# Patient Record
Sex: Male | Born: 1990 | Race: White | Hispanic: No | Marital: Single | Smoking: Never smoker
Health system: Southern US, Community
[De-identification: ages and names within clinical notes are randomized; demographics above are authoritative.]

---
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
## 2010-10-08 ENCOUNTER — Emergency Department (HOSPITAL_COMMUNITY)
Admission: EM | Admit: 2010-10-08 | Discharge: 2010-10-08 | Disposition: A | Discharge status: Home / Self Care | Payer: 59 | Attending: Emergency Medicine | Admitting: Emergency Medicine

## 2010-10-08 DIAGNOSIS — S41119A Laceration without foreign body of unspecified upper arm, initial encounter: Secondary | ICD-10-CM

## 2010-10-08 DIAGNOSIS — S51809A Unspecified open wound of unspecified forearm, initial encounter: Secondary | ICD-10-CM | POA: Insufficient documentation

## 2010-10-08 DIAGNOSIS — W260XXA Contact with knife, initial encounter: Secondary | ICD-10-CM | POA: Insufficient documentation

## 2010-10-08 DIAGNOSIS — W261XXA Contact with sword or dagger, initial encounter: Secondary | ICD-10-CM | POA: Insufficient documentation

## 2010-10-08 NOTE — ED Notes (Signed)
Pt cutting something with knife and knife tip went into left fa. Bleeding controlled with pressure. Sensation present in fingers and able to move all fingers. tetanus unknown.

## 2010-10-08 NOTE — ED Notes (Signed)
Pt has small laceration to his left forearm. States that he accidentally cut it with his pocket knife. No bleeding at this time. Alert and oriented x 3. Skin warm and dry. Color pink. Breath sounds clear and equal bilaterally. Family at bedside.

## 2010-10-08 NOTE — ED Provider Notes (Addendum)
History     Chief Complaint  Patient presents with  . Laceration   HPI Comments: Pt accidentally cut himself with a knife while cutting tape, c/o bleeding to left forearm with minimal pain; bleeding controlled in ED.  Patient is a 20 y.o. male presenting with skin laceration. The history is provided by the patient.  Laceration  The incident occurred less than 1 hour ago. The laceration is located on the left arm. The laceration is 1 cm in size. Injury mechanism: clean knife. The pain is mild. He reports no foreign bodies present. His tetanus status is UTD.    History reviewed. No pertinent past medical history.  History reviewed. No pertinent past surgical history.  No family history on file.  History  Substance Use Topics  . Smoking status: Never Smoker   . Smokeless tobacco: Not on file  . Alcohol Use: No      Review of Systems  Constitutional: Negative for fatigue.  HENT: Negative for congestion and sinus pressure.   Eyes: Negative for discharge.  Respiratory: Negative for cough.   Gastrointestinal: Negative for abdominal pain and diarrhea.  Skin: Negative for rash.  Neurological: Negative for numbness.  Hematological: Negative.   Psychiatric/Behavioral: Negative for hallucinations.    Physical Exam  BP 137/79  Pulse 84  Temp(Src) 98.4 F (36.9 C) (Oral)  Resp 14  Ht 5\' 8"  (1.727 m)  Wt 145 lb (65.772 kg)  BMI 22.05 kg/m2  SpO2 96%  Physical Exam  Constitutional: He is oriented to person, place, and time. He appears well-developed.  HENT:  Head: Normocephalic.  Eyes: Conjunctivae are normal.  Neck: No tracheal deviation present.  Musculoskeletal: Normal range of motion.       1cm in width superficial laceration to anterior left forearm, no active bleeding  Neurological: He is oriented to person, place, and time.  Skin: Skin is warm.  Psychiatric: He has a normal mood and affect.    ED Course  LACERATION REPAIR Date/Time: 10/08/2010 11:17  AM Performed by: Lorelai Huyser L Authorized by: Benny Lennert Consent: Verbal consent obtained. Patient understanding: patient states understanding of the procedure being performed Location: left anterior forearm. Laceration length: 1 cm Foreign bodies: no foreign bodies Tendon involvement: none Nerve involvement: none Vascular damage: no Local anesthetic: none. Irrigation solution: betadine. Amount of cleaning: standard Skin closure: staples Number of sutures: 2 Approximation difficulty: simple Patient tolerance: Patient tolerated the procedure well with no immediate complications. Comments: 1 cm width linear laceration to left anterior forearm; cleaned with betadine; 2 staples    Written by Enos Fling acting as scribe for Dr. Estell Harpin.  MDM       Benny Lennert, MD 10/08/10 1132  Benny Lennert, MD 10/08/10 1133   The chart was scribed for me under my direct supervision.  I personally performed the history, physical, and medical decision making and all procedures in the evaluation of this patient.Benny Lennert, MD 11/15/10 [phone]

## 2019-01-11 ENCOUNTER — Other Ambulatory Visit: Payer: Self-pay | Admitting: *Deleted

## 2019-01-11 DIAGNOSIS — Z20822 Contact with and (suspected) exposure to covid-19: Secondary | ICD-10-CM

## 2019-01-13 ENCOUNTER — Telehealth: Payer: Self-pay | Admitting: General Practice

## 2019-01-13 LAB — NOVEL CORONAVIRUS, NAA: SARS-CoV-2, NAA: NOT DETECTED

## 2019-01-13 NOTE — Telephone Encounter (Signed)
Patient called for Covid test results. He was told that Covid was Not Detected.

## 2020-04-02 DIAGNOSIS — Z658 Other specified problems related to psychosocial circumstances: Secondary | ICD-10-CM | POA: Diagnosis not present

## 2020-04-02 DIAGNOSIS — Z600 Problems of adjustment to life-cycle transitions: Secondary | ICD-10-CM | POA: Diagnosis not present

## 2020-04-02 DIAGNOSIS — R69 Illness, unspecified: Secondary | ICD-10-CM | POA: Diagnosis not present

## 2020-04-02 DIAGNOSIS — Z729 Problem related to lifestyle, unspecified: Secondary | ICD-10-CM | POA: Diagnosis not present

## 2020-04-02 DIAGNOSIS — Z569 Unspecified problems related to employment: Secondary | ICD-10-CM | POA: Diagnosis not present

## 2020-04-05 DIAGNOSIS — Z729 Problem related to lifestyle, unspecified: Secondary | ICD-10-CM | POA: Diagnosis not present

## 2020-04-05 DIAGNOSIS — R69 Illness, unspecified: Secondary | ICD-10-CM | POA: Diagnosis not present

## 2020-04-05 DIAGNOSIS — Z600 Problems of adjustment to life-cycle transitions: Secondary | ICD-10-CM | POA: Diagnosis not present

## 2020-04-05 DIAGNOSIS — Z658 Other specified problems related to psychosocial circumstances: Secondary | ICD-10-CM | POA: Diagnosis not present

## 2020-04-05 DIAGNOSIS — Z569 Unspecified problems related to employment: Secondary | ICD-10-CM | POA: Diagnosis not present

## 2020-04-09 DIAGNOSIS — Z658 Other specified problems related to psychosocial circumstances: Secondary | ICD-10-CM | POA: Diagnosis not present

## 2020-04-09 DIAGNOSIS — Z600 Problems of adjustment to life-cycle transitions: Secondary | ICD-10-CM | POA: Diagnosis not present

## 2020-04-09 DIAGNOSIS — Z729 Problem related to lifestyle, unspecified: Secondary | ICD-10-CM | POA: Diagnosis not present

## 2020-04-09 DIAGNOSIS — R69 Illness, unspecified: Secondary | ICD-10-CM | POA: Diagnosis not present

## 2020-04-09 DIAGNOSIS — Z569 Unspecified problems related to employment: Secondary | ICD-10-CM | POA: Diagnosis not present

## 2020-04-11 DIAGNOSIS — F339 Major depressive disorder, recurrent, unspecified: Secondary | ICD-10-CM | POA: Diagnosis not present

## 2020-04-11 DIAGNOSIS — R69 Illness, unspecified: Secondary | ICD-10-CM | POA: Diagnosis not present

## 2020-04-12 DIAGNOSIS — R69 Illness, unspecified: Secondary | ICD-10-CM | POA: Diagnosis not present

## 2020-04-12 DIAGNOSIS — Z658 Other specified problems related to psychosocial circumstances: Secondary | ICD-10-CM | POA: Diagnosis not present

## 2020-04-12 DIAGNOSIS — Z569 Unspecified problems related to employment: Secondary | ICD-10-CM | POA: Diagnosis not present

## 2020-04-12 DIAGNOSIS — Z600 Problems of adjustment to life-cycle transitions: Secondary | ICD-10-CM | POA: Diagnosis not present

## 2020-04-12 DIAGNOSIS — Z729 Problem related to lifestyle, unspecified: Secondary | ICD-10-CM | POA: Diagnosis not present

## 2020-04-16 DIAGNOSIS — Z600 Problems of adjustment to life-cycle transitions: Secondary | ICD-10-CM | POA: Diagnosis not present

## 2020-04-16 DIAGNOSIS — R69 Illness, unspecified: Secondary | ICD-10-CM | POA: Diagnosis not present

## 2020-04-16 DIAGNOSIS — Z658 Other specified problems related to psychosocial circumstances: Secondary | ICD-10-CM | POA: Diagnosis not present

## 2020-04-16 DIAGNOSIS — Z729 Problem related to lifestyle, unspecified: Secondary | ICD-10-CM | POA: Diagnosis not present

## 2020-04-18 DIAGNOSIS — Z029 Encounter for administrative examinations, unspecified: Secondary | ICD-10-CM | POA: Diagnosis not present

## 2020-04-19 DIAGNOSIS — Z729 Problem related to lifestyle, unspecified: Secondary | ICD-10-CM | POA: Diagnosis not present

## 2020-04-19 DIAGNOSIS — R69 Illness, unspecified: Secondary | ICD-10-CM | POA: Diagnosis not present

## 2020-04-19 DIAGNOSIS — Z658 Other specified problems related to psychosocial circumstances: Secondary | ICD-10-CM | POA: Diagnosis not present

## 2020-04-19 DIAGNOSIS — Z600 Problems of adjustment to life-cycle transitions: Secondary | ICD-10-CM | POA: Diagnosis not present

## 2020-04-23 DIAGNOSIS — Z600 Problems of adjustment to life-cycle transitions: Secondary | ICD-10-CM | POA: Diagnosis not present

## 2020-04-23 DIAGNOSIS — R69 Illness, unspecified: Secondary | ICD-10-CM | POA: Diagnosis not present

## 2020-04-23 DIAGNOSIS — Z729 Problem related to lifestyle, unspecified: Secondary | ICD-10-CM | POA: Diagnosis not present

## 2020-04-23 DIAGNOSIS — Z658 Other specified problems related to psychosocial circumstances: Secondary | ICD-10-CM | POA: Diagnosis not present

## 2020-04-26 DIAGNOSIS — Z658 Other specified problems related to psychosocial circumstances: Secondary | ICD-10-CM | POA: Diagnosis not present

## 2020-04-26 DIAGNOSIS — Z729 Problem related to lifestyle, unspecified: Secondary | ICD-10-CM | POA: Diagnosis not present

## 2020-04-26 DIAGNOSIS — R69 Illness, unspecified: Secondary | ICD-10-CM | POA: Diagnosis not present

## 2020-04-26 DIAGNOSIS — Z600 Problems of adjustment to life-cycle transitions: Secondary | ICD-10-CM | POA: Diagnosis not present

## 2020-05-02 DIAGNOSIS — Z600 Problems of adjustment to life-cycle transitions: Secondary | ICD-10-CM | POA: Diagnosis not present

## 2020-05-02 DIAGNOSIS — R69 Illness, unspecified: Secondary | ICD-10-CM | POA: Diagnosis not present

## 2020-05-02 DIAGNOSIS — Z729 Problem related to lifestyle, unspecified: Secondary | ICD-10-CM | POA: Diagnosis not present

## 2020-05-02 DIAGNOSIS — Z658 Other specified problems related to psychosocial circumstances: Secondary | ICD-10-CM | POA: Diagnosis not present

## 2020-05-02 DIAGNOSIS — Z569 Unspecified problems related to employment: Secondary | ICD-10-CM | POA: Diagnosis not present

## 2020-05-03 DIAGNOSIS — Z569 Unspecified problems related to employment: Secondary | ICD-10-CM | POA: Diagnosis not present

## 2020-05-03 DIAGNOSIS — Z729 Problem related to lifestyle, unspecified: Secondary | ICD-10-CM | POA: Diagnosis not present

## 2020-05-03 DIAGNOSIS — Z600 Problems of adjustment to life-cycle transitions: Secondary | ICD-10-CM | POA: Diagnosis not present

## 2020-05-03 DIAGNOSIS — R69 Illness, unspecified: Secondary | ICD-10-CM | POA: Diagnosis not present

## 2020-05-03 DIAGNOSIS — Z658 Other specified problems related to psychosocial circumstances: Secondary | ICD-10-CM | POA: Diagnosis not present

## 2020-05-11 DIAGNOSIS — Z569 Unspecified problems related to employment: Secondary | ICD-10-CM | POA: Diagnosis not present

## 2020-05-11 DIAGNOSIS — Z600 Problems of adjustment to life-cycle transitions: Secondary | ICD-10-CM | POA: Diagnosis not present

## 2020-05-11 DIAGNOSIS — Z658 Other specified problems related to psychosocial circumstances: Secondary | ICD-10-CM | POA: Diagnosis not present

## 2020-05-11 DIAGNOSIS — R69 Illness, unspecified: Secondary | ICD-10-CM | POA: Diagnosis not present

## 2020-05-11 DIAGNOSIS — Z729 Problem related to lifestyle, unspecified: Secondary | ICD-10-CM | POA: Diagnosis not present

## 2020-05-17 DIAGNOSIS — Z569 Unspecified problems related to employment: Secondary | ICD-10-CM | POA: Diagnosis not present

## 2020-05-17 DIAGNOSIS — R69 Illness, unspecified: Secondary | ICD-10-CM | POA: Diagnosis not present

## 2020-05-17 DIAGNOSIS — Z600 Problems of adjustment to life-cycle transitions: Secondary | ICD-10-CM | POA: Diagnosis not present

## 2020-05-17 DIAGNOSIS — Z729 Problem related to lifestyle, unspecified: Secondary | ICD-10-CM | POA: Diagnosis not present

## 2020-05-17 DIAGNOSIS — Z658 Other specified problems related to psychosocial circumstances: Secondary | ICD-10-CM | POA: Diagnosis not present

## 2020-05-21 DIAGNOSIS — Z600 Problems of adjustment to life-cycle transitions: Secondary | ICD-10-CM | POA: Diagnosis not present

## 2020-05-21 DIAGNOSIS — Z729 Problem related to lifestyle, unspecified: Secondary | ICD-10-CM | POA: Diagnosis not present

## 2020-05-21 DIAGNOSIS — Z658 Other specified problems related to psychosocial circumstances: Secondary | ICD-10-CM | POA: Diagnosis not present

## 2020-05-21 DIAGNOSIS — R69 Illness, unspecified: Secondary | ICD-10-CM | POA: Diagnosis not present

## 2020-05-21 DIAGNOSIS — Z569 Unspecified problems related to employment: Secondary | ICD-10-CM | POA: Diagnosis not present

## 2020-05-24 DIAGNOSIS — Z569 Unspecified problems related to employment: Secondary | ICD-10-CM | POA: Diagnosis not present

## 2020-05-24 DIAGNOSIS — Z600 Problems of adjustment to life-cycle transitions: Secondary | ICD-10-CM | POA: Diagnosis not present

## 2020-05-24 DIAGNOSIS — R69 Illness, unspecified: Secondary | ICD-10-CM | POA: Diagnosis not present

## 2020-05-24 DIAGNOSIS — Z729 Problem related to lifestyle, unspecified: Secondary | ICD-10-CM | POA: Diagnosis not present

## 2020-05-24 DIAGNOSIS — Z658 Other specified problems related to psychosocial circumstances: Secondary | ICD-10-CM | POA: Diagnosis not present

## 2020-05-28 DIAGNOSIS — Z600 Problems of adjustment to life-cycle transitions: Secondary | ICD-10-CM | POA: Diagnosis not present

## 2020-05-28 DIAGNOSIS — Z569 Unspecified problems related to employment: Secondary | ICD-10-CM | POA: Diagnosis not present

## 2020-05-28 DIAGNOSIS — Z658 Other specified problems related to psychosocial circumstances: Secondary | ICD-10-CM | POA: Diagnosis not present

## 2020-05-28 DIAGNOSIS — R69 Illness, unspecified: Secondary | ICD-10-CM | POA: Diagnosis not present

## 2020-05-28 DIAGNOSIS — Z729 Problem related to lifestyle, unspecified: Secondary | ICD-10-CM | POA: Diagnosis not present

## 2020-05-31 DIAGNOSIS — R69 Illness, unspecified: Secondary | ICD-10-CM | POA: Diagnosis not present

## 2020-05-31 DIAGNOSIS — Z658 Other specified problems related to psychosocial circumstances: Secondary | ICD-10-CM | POA: Diagnosis not present

## 2020-05-31 DIAGNOSIS — Z569 Unspecified problems related to employment: Secondary | ICD-10-CM | POA: Diagnosis not present

## 2020-05-31 DIAGNOSIS — Z729 Problem related to lifestyle, unspecified: Secondary | ICD-10-CM | POA: Diagnosis not present

## 2020-05-31 DIAGNOSIS — Z600 Problems of adjustment to life-cycle transitions: Secondary | ICD-10-CM | POA: Diagnosis not present

## 2020-06-04 DIAGNOSIS — Z658 Other specified problems related to psychosocial circumstances: Secondary | ICD-10-CM | POA: Diagnosis not present

## 2020-06-04 DIAGNOSIS — Z569 Unspecified problems related to employment: Secondary | ICD-10-CM | POA: Diagnosis not present

## 2020-06-04 DIAGNOSIS — Z600 Problems of adjustment to life-cycle transitions: Secondary | ICD-10-CM | POA: Diagnosis not present

## 2020-06-04 DIAGNOSIS — R69 Illness, unspecified: Secondary | ICD-10-CM | POA: Diagnosis not present

## 2020-06-04 DIAGNOSIS — Z729 Problem related to lifestyle, unspecified: Secondary | ICD-10-CM | POA: Diagnosis not present

## 2020-06-07 DIAGNOSIS — R69 Illness, unspecified: Secondary | ICD-10-CM | POA: Diagnosis not present

## 2020-06-07 DIAGNOSIS — Z658 Other specified problems related to psychosocial circumstances: Secondary | ICD-10-CM | POA: Diagnosis not present

## 2020-06-07 DIAGNOSIS — Z729 Problem related to lifestyle, unspecified: Secondary | ICD-10-CM | POA: Diagnosis not present

## 2020-06-07 DIAGNOSIS — Z569 Unspecified problems related to employment: Secondary | ICD-10-CM | POA: Diagnosis not present

## 2020-06-07 DIAGNOSIS — Z600 Problems of adjustment to life-cycle transitions: Secondary | ICD-10-CM | POA: Diagnosis not present

## 2020-06-14 DIAGNOSIS — Z658 Other specified problems related to psychosocial circumstances: Secondary | ICD-10-CM | POA: Diagnosis not present

## 2020-06-14 DIAGNOSIS — Z600 Problems of adjustment to life-cycle transitions: Secondary | ICD-10-CM | POA: Diagnosis not present

## 2020-06-14 DIAGNOSIS — Z729 Problem related to lifestyle, unspecified: Secondary | ICD-10-CM | POA: Diagnosis not present

## 2020-06-14 DIAGNOSIS — R69 Illness, unspecified: Secondary | ICD-10-CM | POA: Diagnosis not present

## 2020-06-14 DIAGNOSIS — Z569 Unspecified problems related to employment: Secondary | ICD-10-CM | POA: Diagnosis not present

## 2020-06-19 DIAGNOSIS — R69 Illness, unspecified: Secondary | ICD-10-CM | POA: Diagnosis not present

## 2020-06-19 DIAGNOSIS — Z600 Problems of adjustment to life-cycle transitions: Secondary | ICD-10-CM | POA: Diagnosis not present

## 2020-06-19 DIAGNOSIS — Z658 Other specified problems related to psychosocial circumstances: Secondary | ICD-10-CM | POA: Diagnosis not present

## 2020-06-19 DIAGNOSIS — Z569 Unspecified problems related to employment: Secondary | ICD-10-CM | POA: Diagnosis not present

## 2020-06-19 DIAGNOSIS — Z729 Problem related to lifestyle, unspecified: Secondary | ICD-10-CM | POA: Diagnosis not present

## 2020-06-21 DIAGNOSIS — Z729 Problem related to lifestyle, unspecified: Secondary | ICD-10-CM | POA: Diagnosis not present

## 2020-06-21 DIAGNOSIS — Z600 Problems of adjustment to life-cycle transitions: Secondary | ICD-10-CM | POA: Diagnosis not present

## 2020-06-21 DIAGNOSIS — R69 Illness, unspecified: Secondary | ICD-10-CM | POA: Diagnosis not present

## 2020-06-21 DIAGNOSIS — Z658 Other specified problems related to psychosocial circumstances: Secondary | ICD-10-CM | POA: Diagnosis not present

## 2020-06-21 DIAGNOSIS — Z569 Unspecified problems related to employment: Secondary | ICD-10-CM | POA: Diagnosis not present

## 2020-06-28 DIAGNOSIS — Z729 Problem related to lifestyle, unspecified: Secondary | ICD-10-CM | POA: Diagnosis not present

## 2020-06-28 DIAGNOSIS — Z658 Other specified problems related to psychosocial circumstances: Secondary | ICD-10-CM | POA: Diagnosis not present

## 2020-06-28 DIAGNOSIS — Z600 Problems of adjustment to life-cycle transitions: Secondary | ICD-10-CM | POA: Diagnosis not present

## 2020-06-28 DIAGNOSIS — Z569 Unspecified problems related to employment: Secondary | ICD-10-CM | POA: Diagnosis not present

## 2020-06-28 DIAGNOSIS — R69 Illness, unspecified: Secondary | ICD-10-CM | POA: Diagnosis not present

## 2020-07-05 DIAGNOSIS — R69 Illness, unspecified: Secondary | ICD-10-CM | POA: Diagnosis not present

## 2020-07-05 DIAGNOSIS — Z658 Other specified problems related to psychosocial circumstances: Secondary | ICD-10-CM | POA: Diagnosis not present

## 2020-07-05 DIAGNOSIS — Z729 Problem related to lifestyle, unspecified: Secondary | ICD-10-CM | POA: Diagnosis not present

## 2020-07-05 DIAGNOSIS — Z600 Problems of adjustment to life-cycle transitions: Secondary | ICD-10-CM | POA: Diagnosis not present

## 2020-07-08 DIAGNOSIS — F339 Major depressive disorder, recurrent, unspecified: Secondary | ICD-10-CM | POA: Diagnosis not present

## 2020-07-08 DIAGNOSIS — R69 Illness, unspecified: Secondary | ICD-10-CM | POA: Diagnosis not present

## 2020-07-12 DIAGNOSIS — Z729 Problem related to lifestyle, unspecified: Secondary | ICD-10-CM | POA: Diagnosis not present

## 2020-07-12 DIAGNOSIS — Z658 Other specified problems related to psychosocial circumstances: Secondary | ICD-10-CM | POA: Diagnosis not present

## 2020-07-12 DIAGNOSIS — R69 Illness, unspecified: Secondary | ICD-10-CM | POA: Diagnosis not present

## 2020-07-12 DIAGNOSIS — Z600 Problems of adjustment to life-cycle transitions: Secondary | ICD-10-CM | POA: Diagnosis not present

## 2020-07-19 DIAGNOSIS — R69 Illness, unspecified: Secondary | ICD-10-CM | POA: Diagnosis not present

## 2020-07-19 DIAGNOSIS — Z600 Problems of adjustment to life-cycle transitions: Secondary | ICD-10-CM | POA: Diagnosis not present

## 2020-07-19 DIAGNOSIS — Z658 Other specified problems related to psychosocial circumstances: Secondary | ICD-10-CM | POA: Diagnosis not present

## 2020-07-19 DIAGNOSIS — Z729 Problem related to lifestyle, unspecified: Secondary | ICD-10-CM | POA: Diagnosis not present

## 2020-07-26 DIAGNOSIS — Z658 Other specified problems related to psychosocial circumstances: Secondary | ICD-10-CM | POA: Diagnosis not present

## 2020-07-26 DIAGNOSIS — R69 Illness, unspecified: Secondary | ICD-10-CM | POA: Diagnosis not present

## 2020-07-26 DIAGNOSIS — Z729 Problem related to lifestyle, unspecified: Secondary | ICD-10-CM | POA: Diagnosis not present

## 2020-07-26 DIAGNOSIS — Z600 Problems of adjustment to life-cycle transitions: Secondary | ICD-10-CM | POA: Diagnosis not present

## 2020-08-02 DIAGNOSIS — Z569 Unspecified problems related to employment: Secondary | ICD-10-CM | POA: Diagnosis not present

## 2020-08-02 DIAGNOSIS — Z658 Other specified problems related to psychosocial circumstances: Secondary | ICD-10-CM | POA: Diagnosis not present

## 2020-08-02 DIAGNOSIS — Z729 Problem related to lifestyle, unspecified: Secondary | ICD-10-CM | POA: Diagnosis not present

## 2020-08-02 DIAGNOSIS — R69 Illness, unspecified: Secondary | ICD-10-CM | POA: Diagnosis not present

## 2020-08-02 DIAGNOSIS — Z600 Problems of adjustment to life-cycle transitions: Secondary | ICD-10-CM | POA: Diagnosis not present

## 2020-08-09 DIAGNOSIS — Z569 Unspecified problems related to employment: Secondary | ICD-10-CM | POA: Diagnosis not present

## 2020-08-09 DIAGNOSIS — R69 Illness, unspecified: Secondary | ICD-10-CM | POA: Diagnosis not present

## 2020-08-09 DIAGNOSIS — Z600 Problems of adjustment to life-cycle transitions: Secondary | ICD-10-CM | POA: Diagnosis not present

## 2020-08-09 DIAGNOSIS — Z729 Problem related to lifestyle, unspecified: Secondary | ICD-10-CM | POA: Diagnosis not present

## 2020-08-09 DIAGNOSIS — Z658 Other specified problems related to psychosocial circumstances: Secondary | ICD-10-CM | POA: Diagnosis not present

## 2020-08-16 DIAGNOSIS — Z658 Other specified problems related to psychosocial circumstances: Secondary | ICD-10-CM | POA: Diagnosis not present

## 2020-08-16 DIAGNOSIS — Z729 Problem related to lifestyle, unspecified: Secondary | ICD-10-CM | POA: Diagnosis not present

## 2020-08-16 DIAGNOSIS — Z600 Problems of adjustment to life-cycle transitions: Secondary | ICD-10-CM | POA: Diagnosis not present

## 2020-08-16 DIAGNOSIS — Z569 Unspecified problems related to employment: Secondary | ICD-10-CM | POA: Diagnosis not present

## 2020-08-16 DIAGNOSIS — R69 Illness, unspecified: Secondary | ICD-10-CM | POA: Diagnosis not present

## 2020-08-23 DIAGNOSIS — Z729 Problem related to lifestyle, unspecified: Secondary | ICD-10-CM | POA: Diagnosis not present

## 2020-08-23 DIAGNOSIS — Z658 Other specified problems related to psychosocial circumstances: Secondary | ICD-10-CM | POA: Diagnosis not present

## 2020-08-23 DIAGNOSIS — Z600 Problems of adjustment to life-cycle transitions: Secondary | ICD-10-CM | POA: Diagnosis not present

## 2020-08-23 DIAGNOSIS — R69 Illness, unspecified: Secondary | ICD-10-CM | POA: Diagnosis not present

## 2020-08-23 DIAGNOSIS — Z569 Unspecified problems related to employment: Secondary | ICD-10-CM | POA: Diagnosis not present

## 2020-08-30 DIAGNOSIS — Z600 Problems of adjustment to life-cycle transitions: Secondary | ICD-10-CM | POA: Diagnosis not present

## 2020-08-30 DIAGNOSIS — Z729 Problem related to lifestyle, unspecified: Secondary | ICD-10-CM | POA: Diagnosis not present

## 2020-08-30 DIAGNOSIS — R69 Illness, unspecified: Secondary | ICD-10-CM | POA: Diagnosis not present

## 2020-08-30 DIAGNOSIS — Z569 Unspecified problems related to employment: Secondary | ICD-10-CM | POA: Diagnosis not present

## 2020-08-30 DIAGNOSIS — Z658 Other specified problems related to psychosocial circumstances: Secondary | ICD-10-CM | POA: Diagnosis not present

## 2020-09-02 DIAGNOSIS — F339 Major depressive disorder, recurrent, unspecified: Secondary | ICD-10-CM | POA: Diagnosis not present

## 2020-09-02 DIAGNOSIS — R69 Illness, unspecified: Secondary | ICD-10-CM | POA: Diagnosis not present

## 2020-09-06 DIAGNOSIS — Z729 Problem related to lifestyle, unspecified: Secondary | ICD-10-CM | POA: Diagnosis not present

## 2020-09-06 DIAGNOSIS — Z658 Other specified problems related to psychosocial circumstances: Secondary | ICD-10-CM | POA: Diagnosis not present

## 2020-09-06 DIAGNOSIS — R69 Illness, unspecified: Secondary | ICD-10-CM | POA: Diagnosis not present

## 2020-09-06 DIAGNOSIS — Z569 Unspecified problems related to employment: Secondary | ICD-10-CM | POA: Diagnosis not present

## 2020-09-06 DIAGNOSIS — Z600 Problems of adjustment to life-cycle transitions: Secondary | ICD-10-CM | POA: Diagnosis not present

## 2020-09-13 DIAGNOSIS — Z658 Other specified problems related to psychosocial circumstances: Secondary | ICD-10-CM | POA: Diagnosis not present

## 2020-09-13 DIAGNOSIS — Z569 Unspecified problems related to employment: Secondary | ICD-10-CM | POA: Diagnosis not present

## 2020-09-13 DIAGNOSIS — Z600 Problems of adjustment to life-cycle transitions: Secondary | ICD-10-CM | POA: Diagnosis not present

## 2020-09-13 DIAGNOSIS — Z729 Problem related to lifestyle, unspecified: Secondary | ICD-10-CM | POA: Diagnosis not present

## 2020-09-13 DIAGNOSIS — R69 Illness, unspecified: Secondary | ICD-10-CM | POA: Diagnosis not present

## 2020-09-20 DIAGNOSIS — Z569 Unspecified problems related to employment: Secondary | ICD-10-CM | POA: Diagnosis not present

## 2020-09-20 DIAGNOSIS — Z600 Problems of adjustment to life-cycle transitions: Secondary | ICD-10-CM | POA: Diagnosis not present

## 2020-09-20 DIAGNOSIS — Z729 Problem related to lifestyle, unspecified: Secondary | ICD-10-CM | POA: Diagnosis not present

## 2020-09-20 DIAGNOSIS — Z658 Other specified problems related to psychosocial circumstances: Secondary | ICD-10-CM | POA: Diagnosis not present

## 2020-09-20 DIAGNOSIS — R69 Illness, unspecified: Secondary | ICD-10-CM | POA: Diagnosis not present

## 2020-09-27 DIAGNOSIS — Z600 Problems of adjustment to life-cycle transitions: Secondary | ICD-10-CM | POA: Diagnosis not present

## 2020-09-27 DIAGNOSIS — Z658 Other specified problems related to psychosocial circumstances: Secondary | ICD-10-CM | POA: Diagnosis not present

## 2020-09-27 DIAGNOSIS — R69 Illness, unspecified: Secondary | ICD-10-CM | POA: Diagnosis not present

## 2020-09-27 DIAGNOSIS — Z569 Unspecified problems related to employment: Secondary | ICD-10-CM | POA: Diagnosis not present

## 2020-09-27 DIAGNOSIS — Z729 Problem related to lifestyle, unspecified: Secondary | ICD-10-CM | POA: Diagnosis not present

## 2020-10-04 DIAGNOSIS — Z600 Problems of adjustment to life-cycle transitions: Secondary | ICD-10-CM | POA: Diagnosis not present

## 2020-10-04 DIAGNOSIS — Z729 Problem related to lifestyle, unspecified: Secondary | ICD-10-CM | POA: Diagnosis not present

## 2020-10-04 DIAGNOSIS — R69 Illness, unspecified: Secondary | ICD-10-CM | POA: Diagnosis not present

## 2020-10-04 DIAGNOSIS — Z658 Other specified problems related to psychosocial circumstances: Secondary | ICD-10-CM | POA: Diagnosis not present

## 2020-10-04 DIAGNOSIS — Z569 Unspecified problems related to employment: Secondary | ICD-10-CM | POA: Diagnosis not present

## 2020-10-05 DIAGNOSIS — Z569 Unspecified problems related to employment: Secondary | ICD-10-CM | POA: Diagnosis not present

## 2020-10-05 DIAGNOSIS — R69 Illness, unspecified: Secondary | ICD-10-CM | POA: Diagnosis not present

## 2020-10-05 DIAGNOSIS — Z729 Problem related to lifestyle, unspecified: Secondary | ICD-10-CM | POA: Diagnosis not present

## 2020-10-05 DIAGNOSIS — Z600 Problems of adjustment to life-cycle transitions: Secondary | ICD-10-CM | POA: Diagnosis not present

## 2020-10-05 DIAGNOSIS — Z658 Other specified problems related to psychosocial circumstances: Secondary | ICD-10-CM | POA: Diagnosis not present

## 2020-10-11 DIAGNOSIS — Z658 Other specified problems related to psychosocial circumstances: Secondary | ICD-10-CM | POA: Diagnosis not present

## 2020-10-11 DIAGNOSIS — Z729 Problem related to lifestyle, unspecified: Secondary | ICD-10-CM | POA: Diagnosis not present

## 2020-10-11 DIAGNOSIS — Z569 Unspecified problems related to employment: Secondary | ICD-10-CM | POA: Diagnosis not present

## 2020-10-11 DIAGNOSIS — R69 Illness, unspecified: Secondary | ICD-10-CM | POA: Diagnosis not present

## 2020-10-11 DIAGNOSIS — Z600 Problems of adjustment to life-cycle transitions: Secondary | ICD-10-CM | POA: Diagnosis not present

## 2020-10-15 DIAGNOSIS — Z600 Problems of adjustment to life-cycle transitions: Secondary | ICD-10-CM | POA: Diagnosis not present

## 2020-10-15 DIAGNOSIS — Z569 Unspecified problems related to employment: Secondary | ICD-10-CM | POA: Diagnosis not present

## 2020-10-15 DIAGNOSIS — Z729 Problem related to lifestyle, unspecified: Secondary | ICD-10-CM | POA: Diagnosis not present

## 2020-10-15 DIAGNOSIS — R69 Illness, unspecified: Secondary | ICD-10-CM | POA: Diagnosis not present

## 2020-10-15 DIAGNOSIS — Z658 Other specified problems related to psychosocial circumstances: Secondary | ICD-10-CM | POA: Diagnosis not present

## 2020-10-18 DIAGNOSIS — Z600 Problems of adjustment to life-cycle transitions: Secondary | ICD-10-CM | POA: Diagnosis not present

## 2020-10-18 DIAGNOSIS — Z658 Other specified problems related to psychosocial circumstances: Secondary | ICD-10-CM | POA: Diagnosis not present

## 2020-10-18 DIAGNOSIS — R69 Illness, unspecified: Secondary | ICD-10-CM | POA: Diagnosis not present

## 2020-10-18 DIAGNOSIS — Z569 Unspecified problems related to employment: Secondary | ICD-10-CM | POA: Diagnosis not present

## 2020-10-18 DIAGNOSIS — Z729 Problem related to lifestyle, unspecified: Secondary | ICD-10-CM | POA: Diagnosis not present

## 2020-10-25 DIAGNOSIS — R69 Illness, unspecified: Secondary | ICD-10-CM | POA: Diagnosis not present

## 2020-10-25 DIAGNOSIS — Z729 Problem related to lifestyle, unspecified: Secondary | ICD-10-CM | POA: Diagnosis not present

## 2020-10-25 DIAGNOSIS — Z600 Problems of adjustment to life-cycle transitions: Secondary | ICD-10-CM | POA: Diagnosis not present

## 2020-10-25 DIAGNOSIS — Z658 Other specified problems related to psychosocial circumstances: Secondary | ICD-10-CM | POA: Diagnosis not present

## 2020-10-25 DIAGNOSIS — Z569 Unspecified problems related to employment: Secondary | ICD-10-CM | POA: Diagnosis not present

## 2020-11-01 DIAGNOSIS — Z600 Problems of adjustment to life-cycle transitions: Secondary | ICD-10-CM | POA: Diagnosis not present

## 2020-11-01 DIAGNOSIS — Z569 Unspecified problems related to employment: Secondary | ICD-10-CM | POA: Diagnosis not present

## 2020-11-01 DIAGNOSIS — Z658 Other specified problems related to psychosocial circumstances: Secondary | ICD-10-CM | POA: Diagnosis not present

## 2020-11-01 DIAGNOSIS — R69 Illness, unspecified: Secondary | ICD-10-CM | POA: Diagnosis not present

## 2020-11-01 DIAGNOSIS — Z729 Problem related to lifestyle, unspecified: Secondary | ICD-10-CM | POA: Diagnosis not present

## 2020-11-08 DIAGNOSIS — Z600 Problems of adjustment to life-cycle transitions: Secondary | ICD-10-CM | POA: Diagnosis not present

## 2020-11-08 DIAGNOSIS — R69 Illness, unspecified: Secondary | ICD-10-CM | POA: Diagnosis not present

## 2020-11-08 DIAGNOSIS — Z658 Other specified problems related to psychosocial circumstances: Secondary | ICD-10-CM | POA: Diagnosis not present

## 2020-11-08 DIAGNOSIS — Z569 Unspecified problems related to employment: Secondary | ICD-10-CM | POA: Diagnosis not present

## 2020-11-08 DIAGNOSIS — Z729 Problem related to lifestyle, unspecified: Secondary | ICD-10-CM | POA: Diagnosis not present

## 2020-11-15 DIAGNOSIS — R69 Illness, unspecified: Secondary | ICD-10-CM | POA: Diagnosis not present

## 2020-11-15 DIAGNOSIS — Z600 Problems of adjustment to life-cycle transitions: Secondary | ICD-10-CM | POA: Diagnosis not present

## 2020-11-15 DIAGNOSIS — Z569 Unspecified problems related to employment: Secondary | ICD-10-CM | POA: Diagnosis not present

## 2020-11-15 DIAGNOSIS — Z729 Problem related to lifestyle, unspecified: Secondary | ICD-10-CM | POA: Diagnosis not present

## 2020-11-15 DIAGNOSIS — Z658 Other specified problems related to psychosocial circumstances: Secondary | ICD-10-CM | POA: Diagnosis not present

## 2020-11-22 DIAGNOSIS — Z658 Other specified problems related to psychosocial circumstances: Secondary | ICD-10-CM | POA: Diagnosis not present

## 2020-11-22 DIAGNOSIS — Z729 Problem related to lifestyle, unspecified: Secondary | ICD-10-CM | POA: Diagnosis not present

## 2020-11-22 DIAGNOSIS — Z600 Problems of adjustment to life-cycle transitions: Secondary | ICD-10-CM | POA: Diagnosis not present

## 2020-11-22 DIAGNOSIS — Z569 Unspecified problems related to employment: Secondary | ICD-10-CM | POA: Diagnosis not present

## 2020-11-22 DIAGNOSIS — R69 Illness, unspecified: Secondary | ICD-10-CM | POA: Diagnosis not present

## 2020-11-29 DIAGNOSIS — Z569 Unspecified problems related to employment: Secondary | ICD-10-CM | POA: Diagnosis not present

## 2020-11-29 DIAGNOSIS — R69 Illness, unspecified: Secondary | ICD-10-CM | POA: Diagnosis not present

## 2020-11-29 DIAGNOSIS — Z729 Problem related to lifestyle, unspecified: Secondary | ICD-10-CM | POA: Diagnosis not present

## 2020-11-29 DIAGNOSIS — Z600 Problems of adjustment to life-cycle transitions: Secondary | ICD-10-CM | POA: Diagnosis not present

## 2020-11-29 DIAGNOSIS — Z658 Other specified problems related to psychosocial circumstances: Secondary | ICD-10-CM | POA: Diagnosis not present

## 2020-12-06 DIAGNOSIS — Z658 Other specified problems related to psychosocial circumstances: Secondary | ICD-10-CM | POA: Diagnosis not present

## 2020-12-06 DIAGNOSIS — R69 Illness, unspecified: Secondary | ICD-10-CM | POA: Diagnosis not present

## 2020-12-06 DIAGNOSIS — Z600 Problems of adjustment to life-cycle transitions: Secondary | ICD-10-CM | POA: Diagnosis not present

## 2020-12-06 DIAGNOSIS — Z729 Problem related to lifestyle, unspecified: Secondary | ICD-10-CM | POA: Diagnosis not present

## 2020-12-06 DIAGNOSIS — Z569 Unspecified problems related to employment: Secondary | ICD-10-CM | POA: Diagnosis not present

## 2020-12-13 DIAGNOSIS — Z658 Other specified problems related to psychosocial circumstances: Secondary | ICD-10-CM | POA: Diagnosis not present

## 2020-12-13 DIAGNOSIS — Z729 Problem related to lifestyle, unspecified: Secondary | ICD-10-CM | POA: Diagnosis not present

## 2020-12-13 DIAGNOSIS — R69 Illness, unspecified: Secondary | ICD-10-CM | POA: Diagnosis not present

## 2020-12-13 DIAGNOSIS — Z600 Problems of adjustment to life-cycle transitions: Secondary | ICD-10-CM | POA: Diagnosis not present

## 2020-12-13 DIAGNOSIS — Z569 Unspecified problems related to employment: Secondary | ICD-10-CM | POA: Diagnosis not present

## 2020-12-20 DIAGNOSIS — Z600 Problems of adjustment to life-cycle transitions: Secondary | ICD-10-CM | POA: Diagnosis not present

## 2020-12-20 DIAGNOSIS — Z569 Unspecified problems related to employment: Secondary | ICD-10-CM | POA: Diagnosis not present

## 2020-12-20 DIAGNOSIS — Z729 Problem related to lifestyle, unspecified: Secondary | ICD-10-CM | POA: Diagnosis not present

## 2020-12-20 DIAGNOSIS — R69 Illness, unspecified: Secondary | ICD-10-CM | POA: Diagnosis not present

## 2020-12-20 DIAGNOSIS — Z658 Other specified problems related to psychosocial circumstances: Secondary | ICD-10-CM | POA: Diagnosis not present

## 2020-12-27 DIAGNOSIS — R69 Illness, unspecified: Secondary | ICD-10-CM | POA: Diagnosis not present

## 2020-12-27 DIAGNOSIS — Z569 Unspecified problems related to employment: Secondary | ICD-10-CM | POA: Diagnosis not present

## 2020-12-27 DIAGNOSIS — Z658 Other specified problems related to psychosocial circumstances: Secondary | ICD-10-CM | POA: Diagnosis not present

## 2020-12-27 DIAGNOSIS — Z729 Problem related to lifestyle, unspecified: Secondary | ICD-10-CM | POA: Diagnosis not present

## 2020-12-27 DIAGNOSIS — Z600 Problems of adjustment to life-cycle transitions: Secondary | ICD-10-CM | POA: Diagnosis not present

## 2021-01-03 DIAGNOSIS — R69 Illness, unspecified: Secondary | ICD-10-CM | POA: Diagnosis not present

## 2021-01-03 DIAGNOSIS — Z600 Problems of adjustment to life-cycle transitions: Secondary | ICD-10-CM | POA: Diagnosis not present

## 2021-01-03 DIAGNOSIS — Z729 Problem related to lifestyle, unspecified: Secondary | ICD-10-CM | POA: Diagnosis not present

## 2021-01-03 DIAGNOSIS — Z569 Unspecified problems related to employment: Secondary | ICD-10-CM | POA: Diagnosis not present

## 2021-01-03 DIAGNOSIS — Z658 Other specified problems related to psychosocial circumstances: Secondary | ICD-10-CM | POA: Diagnosis not present

## 2021-01-10 DIAGNOSIS — Z729 Problem related to lifestyle, unspecified: Secondary | ICD-10-CM | POA: Diagnosis not present

## 2021-01-10 DIAGNOSIS — Z658 Other specified problems related to psychosocial circumstances: Secondary | ICD-10-CM | POA: Diagnosis not present

## 2021-01-10 DIAGNOSIS — Z569 Unspecified problems related to employment: Secondary | ICD-10-CM | POA: Diagnosis not present

## 2021-01-10 DIAGNOSIS — R69 Illness, unspecified: Secondary | ICD-10-CM | POA: Diagnosis not present

## 2021-01-10 DIAGNOSIS — Z600 Problems of adjustment to life-cycle transitions: Secondary | ICD-10-CM | POA: Diagnosis not present

## 2021-01-15 DIAGNOSIS — F339 Major depressive disorder, recurrent, unspecified: Secondary | ICD-10-CM | POA: Diagnosis not present

## 2021-01-15 DIAGNOSIS — R69 Illness, unspecified: Secondary | ICD-10-CM | POA: Diagnosis not present

## 2021-01-17 DIAGNOSIS — Z569 Unspecified problems related to employment: Secondary | ICD-10-CM | POA: Diagnosis not present

## 2021-01-17 DIAGNOSIS — Z658 Other specified problems related to psychosocial circumstances: Secondary | ICD-10-CM | POA: Diagnosis not present

## 2021-01-17 DIAGNOSIS — Z600 Problems of adjustment to life-cycle transitions: Secondary | ICD-10-CM | POA: Diagnosis not present

## 2021-01-17 DIAGNOSIS — Z729 Problem related to lifestyle, unspecified: Secondary | ICD-10-CM | POA: Diagnosis not present

## 2021-01-17 DIAGNOSIS — R69 Illness, unspecified: Secondary | ICD-10-CM | POA: Diagnosis not present

## 2021-01-24 DIAGNOSIS — Z569 Unspecified problems related to employment: Secondary | ICD-10-CM | POA: Diagnosis not present

## 2021-01-24 DIAGNOSIS — Z658 Other specified problems related to psychosocial circumstances: Secondary | ICD-10-CM | POA: Diagnosis not present

## 2021-01-24 DIAGNOSIS — Z729 Problem related to lifestyle, unspecified: Secondary | ICD-10-CM | POA: Diagnosis not present

## 2021-01-24 DIAGNOSIS — R69 Illness, unspecified: Secondary | ICD-10-CM | POA: Diagnosis not present

## 2021-01-24 DIAGNOSIS — Z600 Problems of adjustment to life-cycle transitions: Secondary | ICD-10-CM | POA: Diagnosis not present

## 2021-01-31 DIAGNOSIS — Z569 Unspecified problems related to employment: Secondary | ICD-10-CM | POA: Diagnosis not present

## 2021-01-31 DIAGNOSIS — Z600 Problems of adjustment to life-cycle transitions: Secondary | ICD-10-CM | POA: Diagnosis not present

## 2021-01-31 DIAGNOSIS — R69 Illness, unspecified: Secondary | ICD-10-CM | POA: Diagnosis not present

## 2021-01-31 DIAGNOSIS — Z658 Other specified problems related to psychosocial circumstances: Secondary | ICD-10-CM | POA: Diagnosis not present

## 2021-01-31 DIAGNOSIS — Z729 Problem related to lifestyle, unspecified: Secondary | ICD-10-CM | POA: Diagnosis not present

## 2021-02-05 DIAGNOSIS — Z600 Problems of adjustment to life-cycle transitions: Secondary | ICD-10-CM | POA: Diagnosis not present

## 2021-02-05 DIAGNOSIS — Z658 Other specified problems related to psychosocial circumstances: Secondary | ICD-10-CM | POA: Diagnosis not present

## 2021-02-05 DIAGNOSIS — R69 Illness, unspecified: Secondary | ICD-10-CM | POA: Diagnosis not present

## 2021-02-05 DIAGNOSIS — Z729 Problem related to lifestyle, unspecified: Secondary | ICD-10-CM | POA: Diagnosis not present

## 2021-02-07 DIAGNOSIS — Z658 Other specified problems related to psychosocial circumstances: Secondary | ICD-10-CM | POA: Diagnosis not present

## 2021-02-07 DIAGNOSIS — Z729 Problem related to lifestyle, unspecified: Secondary | ICD-10-CM | POA: Diagnosis not present

## 2021-02-07 DIAGNOSIS — Z600 Problems of adjustment to life-cycle transitions: Secondary | ICD-10-CM | POA: Diagnosis not present

## 2021-02-07 DIAGNOSIS — R69 Illness, unspecified: Secondary | ICD-10-CM | POA: Diagnosis not present

## 2021-02-14 DIAGNOSIS — Z729 Problem related to lifestyle, unspecified: Secondary | ICD-10-CM | POA: Diagnosis not present

## 2021-02-14 DIAGNOSIS — Z600 Problems of adjustment to life-cycle transitions: Secondary | ICD-10-CM | POA: Diagnosis not present

## 2021-02-14 DIAGNOSIS — Z658 Other specified problems related to psychosocial circumstances: Secondary | ICD-10-CM | POA: Diagnosis not present

## 2021-02-14 DIAGNOSIS — R69 Illness, unspecified: Secondary | ICD-10-CM | POA: Diagnosis not present

## 2021-02-22 DIAGNOSIS — Z729 Problem related to lifestyle, unspecified: Secondary | ICD-10-CM | POA: Diagnosis not present

## 2021-02-22 DIAGNOSIS — R69 Illness, unspecified: Secondary | ICD-10-CM | POA: Diagnosis not present

## 2021-02-22 DIAGNOSIS — Z600 Problems of adjustment to life-cycle transitions: Secondary | ICD-10-CM | POA: Diagnosis not present

## 2021-02-22 DIAGNOSIS — Z658 Other specified problems related to psychosocial circumstances: Secondary | ICD-10-CM | POA: Diagnosis not present

## 2021-02-28 DIAGNOSIS — Z658 Other specified problems related to psychosocial circumstances: Secondary | ICD-10-CM | POA: Diagnosis not present

## 2021-02-28 DIAGNOSIS — Z729 Problem related to lifestyle, unspecified: Secondary | ICD-10-CM | POA: Diagnosis not present

## 2021-02-28 DIAGNOSIS — R69 Illness, unspecified: Secondary | ICD-10-CM | POA: Diagnosis not present

## 2021-02-28 DIAGNOSIS — Z600 Problems of adjustment to life-cycle transitions: Secondary | ICD-10-CM | POA: Diagnosis not present

## 2021-03-07 DIAGNOSIS — Z569 Unspecified problems related to employment: Secondary | ICD-10-CM | POA: Diagnosis not present

## 2021-03-07 DIAGNOSIS — R69 Illness, unspecified: Secondary | ICD-10-CM | POA: Diagnosis not present

## 2021-03-07 DIAGNOSIS — Z600 Problems of adjustment to life-cycle transitions: Secondary | ICD-10-CM | POA: Diagnosis not present

## 2021-03-07 DIAGNOSIS — Z658 Other specified problems related to psychosocial circumstances: Secondary | ICD-10-CM | POA: Diagnosis not present

## 2021-03-07 DIAGNOSIS — Z729 Problem related to lifestyle, unspecified: Secondary | ICD-10-CM | POA: Diagnosis not present

## 2021-03-14 DIAGNOSIS — Z658 Other specified problems related to psychosocial circumstances: Secondary | ICD-10-CM | POA: Diagnosis not present

## 2021-03-14 DIAGNOSIS — Z569 Unspecified problems related to employment: Secondary | ICD-10-CM | POA: Diagnosis not present

## 2021-03-14 DIAGNOSIS — Z600 Problems of adjustment to life-cycle transitions: Secondary | ICD-10-CM | POA: Diagnosis not present

## 2021-03-14 DIAGNOSIS — Z729 Problem related to lifestyle, unspecified: Secondary | ICD-10-CM | POA: Diagnosis not present

## 2021-03-14 DIAGNOSIS — R69 Illness, unspecified: Secondary | ICD-10-CM | POA: Diagnosis not present

## 2021-03-19 DIAGNOSIS — R69 Illness, unspecified: Secondary | ICD-10-CM | POA: Diagnosis not present

## 2021-03-19 DIAGNOSIS — Z729 Problem related to lifestyle, unspecified: Secondary | ICD-10-CM | POA: Diagnosis not present

## 2021-03-19 DIAGNOSIS — Z600 Problems of adjustment to life-cycle transitions: Secondary | ICD-10-CM | POA: Diagnosis not present

## 2021-03-19 DIAGNOSIS — Z658 Other specified problems related to psychosocial circumstances: Secondary | ICD-10-CM | POA: Diagnosis not present

## 2021-03-19 DIAGNOSIS — Z569 Unspecified problems related to employment: Secondary | ICD-10-CM | POA: Diagnosis not present

## 2021-03-28 DIAGNOSIS — R69 Illness, unspecified: Secondary | ICD-10-CM | POA: Diagnosis not present

## 2021-03-28 DIAGNOSIS — Z569 Unspecified problems related to employment: Secondary | ICD-10-CM | POA: Diagnosis not present

## 2021-03-28 DIAGNOSIS — Z600 Problems of adjustment to life-cycle transitions: Secondary | ICD-10-CM | POA: Diagnosis not present

## 2021-03-28 DIAGNOSIS — Z729 Problem related to lifestyle, unspecified: Secondary | ICD-10-CM | POA: Diagnosis not present

## 2021-03-28 DIAGNOSIS — Z658 Other specified problems related to psychosocial circumstances: Secondary | ICD-10-CM | POA: Diagnosis not present

## 2021-04-04 DIAGNOSIS — Z729 Problem related to lifestyle, unspecified: Secondary | ICD-10-CM | POA: Diagnosis not present

## 2021-04-04 DIAGNOSIS — Z658 Other specified problems related to psychosocial circumstances: Secondary | ICD-10-CM | POA: Diagnosis not present

## 2021-04-04 DIAGNOSIS — Z600 Problems of adjustment to life-cycle transitions: Secondary | ICD-10-CM | POA: Diagnosis not present

## 2021-04-04 DIAGNOSIS — R69 Illness, unspecified: Secondary | ICD-10-CM | POA: Diagnosis not present

## 2021-04-11 DIAGNOSIS — Z658 Other specified problems related to psychosocial circumstances: Secondary | ICD-10-CM | POA: Diagnosis not present

## 2021-04-11 DIAGNOSIS — Z729 Problem related to lifestyle, unspecified: Secondary | ICD-10-CM | POA: Diagnosis not present

## 2021-04-11 DIAGNOSIS — Z600 Problems of adjustment to life-cycle transitions: Secondary | ICD-10-CM | POA: Diagnosis not present

## 2021-04-11 DIAGNOSIS — R69 Illness, unspecified: Secondary | ICD-10-CM | POA: Diagnosis not present

## 2021-04-18 DIAGNOSIS — Z600 Problems of adjustment to life-cycle transitions: Secondary | ICD-10-CM | POA: Diagnosis not present

## 2021-04-18 DIAGNOSIS — Z729 Problem related to lifestyle, unspecified: Secondary | ICD-10-CM | POA: Diagnosis not present

## 2021-04-18 DIAGNOSIS — R69 Illness, unspecified: Secondary | ICD-10-CM | POA: Diagnosis not present

## 2021-04-18 DIAGNOSIS — Z658 Other specified problems related to psychosocial circumstances: Secondary | ICD-10-CM | POA: Diagnosis not present

## 2021-04-25 DIAGNOSIS — R69 Illness, unspecified: Secondary | ICD-10-CM | POA: Diagnosis not present

## 2021-04-25 DIAGNOSIS — Z600 Problems of adjustment to life-cycle transitions: Secondary | ICD-10-CM | POA: Diagnosis not present

## 2021-04-25 DIAGNOSIS — Z658 Other specified problems related to psychosocial circumstances: Secondary | ICD-10-CM | POA: Diagnosis not present

## 2021-04-25 DIAGNOSIS — Z729 Problem related to lifestyle, unspecified: Secondary | ICD-10-CM | POA: Diagnosis not present

## 2021-05-02 DIAGNOSIS — Z729 Problem related to lifestyle, unspecified: Secondary | ICD-10-CM | POA: Diagnosis not present

## 2021-05-02 DIAGNOSIS — Z600 Problems of adjustment to life-cycle transitions: Secondary | ICD-10-CM | POA: Diagnosis not present

## 2021-05-02 DIAGNOSIS — R69 Illness, unspecified: Secondary | ICD-10-CM | POA: Diagnosis not present

## 2021-05-02 DIAGNOSIS — Z658 Other specified problems related to psychosocial circumstances: Secondary | ICD-10-CM | POA: Diagnosis not present

## 2021-05-09 DIAGNOSIS — Z600 Problems of adjustment to life-cycle transitions: Secondary | ICD-10-CM | POA: Diagnosis not present

## 2021-05-09 DIAGNOSIS — Z658 Other specified problems related to psychosocial circumstances: Secondary | ICD-10-CM | POA: Diagnosis not present

## 2021-05-09 DIAGNOSIS — R69 Illness, unspecified: Secondary | ICD-10-CM | POA: Diagnosis not present

## 2021-05-09 DIAGNOSIS — Z729 Problem related to lifestyle, unspecified: Secondary | ICD-10-CM | POA: Diagnosis not present

## 2021-05-16 DIAGNOSIS — Z729 Problem related to lifestyle, unspecified: Secondary | ICD-10-CM | POA: Diagnosis not present

## 2021-05-16 DIAGNOSIS — Z600 Problems of adjustment to life-cycle transitions: Secondary | ICD-10-CM | POA: Diagnosis not present

## 2021-05-16 DIAGNOSIS — Z658 Other specified problems related to psychosocial circumstances: Secondary | ICD-10-CM | POA: Diagnosis not present

## 2021-05-16 DIAGNOSIS — R69 Illness, unspecified: Secondary | ICD-10-CM | POA: Diagnosis not present

## 2021-05-23 DIAGNOSIS — Z658 Other specified problems related to psychosocial circumstances: Secondary | ICD-10-CM | POA: Diagnosis not present

## 2021-05-23 DIAGNOSIS — Z729 Problem related to lifestyle, unspecified: Secondary | ICD-10-CM | POA: Diagnosis not present

## 2021-05-23 DIAGNOSIS — Z600 Problems of adjustment to life-cycle transitions: Secondary | ICD-10-CM | POA: Diagnosis not present

## 2021-05-23 DIAGNOSIS — R69 Illness, unspecified: Secondary | ICD-10-CM | POA: Diagnosis not present

## 2021-05-30 DIAGNOSIS — Z658 Other specified problems related to psychosocial circumstances: Secondary | ICD-10-CM | POA: Diagnosis not present

## 2021-05-30 DIAGNOSIS — Z729 Problem related to lifestyle, unspecified: Secondary | ICD-10-CM | POA: Diagnosis not present

## 2021-05-30 DIAGNOSIS — R69 Illness, unspecified: Secondary | ICD-10-CM | POA: Diagnosis not present

## 2021-05-30 DIAGNOSIS — Z600 Problems of adjustment to life-cycle transitions: Secondary | ICD-10-CM | POA: Diagnosis not present

## 2021-06-06 DIAGNOSIS — R69 Illness, unspecified: Secondary | ICD-10-CM | POA: Diagnosis not present

## 2021-06-06 DIAGNOSIS — Z600 Problems of adjustment to life-cycle transitions: Secondary | ICD-10-CM | POA: Diagnosis not present

## 2021-06-06 DIAGNOSIS — Z658 Other specified problems related to psychosocial circumstances: Secondary | ICD-10-CM | POA: Diagnosis not present

## 2021-06-06 DIAGNOSIS — Z729 Problem related to lifestyle, unspecified: Secondary | ICD-10-CM | POA: Diagnosis not present

## 2021-06-13 DIAGNOSIS — Z600 Problems of adjustment to life-cycle transitions: Secondary | ICD-10-CM | POA: Diagnosis not present

## 2021-06-13 DIAGNOSIS — Z658 Other specified problems related to psychosocial circumstances: Secondary | ICD-10-CM | POA: Diagnosis not present

## 2021-06-13 DIAGNOSIS — Z729 Problem related to lifestyle, unspecified: Secondary | ICD-10-CM | POA: Diagnosis not present

## 2021-06-13 DIAGNOSIS — R69 Illness, unspecified: Secondary | ICD-10-CM | POA: Diagnosis not present

## 2021-06-20 DIAGNOSIS — Z658 Other specified problems related to psychosocial circumstances: Secondary | ICD-10-CM | POA: Diagnosis not present

## 2021-06-20 DIAGNOSIS — Z729 Problem related to lifestyle, unspecified: Secondary | ICD-10-CM | POA: Diagnosis not present

## 2021-06-20 DIAGNOSIS — Z600 Problems of adjustment to life-cycle transitions: Secondary | ICD-10-CM | POA: Diagnosis not present

## 2021-06-20 DIAGNOSIS — R69 Illness, unspecified: Secondary | ICD-10-CM | POA: Diagnosis not present

## 2021-06-27 DIAGNOSIS — R69 Illness, unspecified: Secondary | ICD-10-CM | POA: Diagnosis not present

## 2021-06-27 DIAGNOSIS — Z729 Problem related to lifestyle, unspecified: Secondary | ICD-10-CM | POA: Diagnosis not present

## 2021-06-27 DIAGNOSIS — Z600 Problems of adjustment to life-cycle transitions: Secondary | ICD-10-CM | POA: Diagnosis not present

## 2021-07-04 DIAGNOSIS — Z658 Other specified problems related to psychosocial circumstances: Secondary | ICD-10-CM | POA: Diagnosis not present

## 2021-07-04 DIAGNOSIS — Z729 Problem related to lifestyle, unspecified: Secondary | ICD-10-CM | POA: Diagnosis not present

## 2021-07-04 DIAGNOSIS — R69 Illness, unspecified: Secondary | ICD-10-CM | POA: Diagnosis not present

## 2021-07-11 DIAGNOSIS — Z609 Problem related to social environment, unspecified: Secondary | ICD-10-CM | POA: Diagnosis not present

## 2021-07-11 DIAGNOSIS — Z658 Other specified problems related to psychosocial circumstances: Secondary | ICD-10-CM | POA: Diagnosis not present

## 2021-07-11 DIAGNOSIS — R69 Illness, unspecified: Secondary | ICD-10-CM | POA: Diagnosis not present

## 2021-07-11 DIAGNOSIS — Z600 Problems of adjustment to life-cycle transitions: Secondary | ICD-10-CM | POA: Diagnosis not present

## 2021-07-18 DIAGNOSIS — Z658 Other specified problems related to psychosocial circumstances: Secondary | ICD-10-CM | POA: Diagnosis not present

## 2021-07-18 DIAGNOSIS — Z729 Problem related to lifestyle, unspecified: Secondary | ICD-10-CM | POA: Diagnosis not present

## 2021-07-18 DIAGNOSIS — R69 Illness, unspecified: Secondary | ICD-10-CM | POA: Diagnosis not present

## 2021-07-18 DIAGNOSIS — Z600 Problems of adjustment to life-cycle transitions: Secondary | ICD-10-CM | POA: Diagnosis not present

## 2021-07-25 DIAGNOSIS — R69 Illness, unspecified: Secondary | ICD-10-CM | POA: Diagnosis not present

## 2021-07-25 DIAGNOSIS — Z600 Problems of adjustment to life-cycle transitions: Secondary | ICD-10-CM | POA: Diagnosis not present

## 2021-07-25 DIAGNOSIS — Z729 Problem related to lifestyle, unspecified: Secondary | ICD-10-CM | POA: Diagnosis not present

## 2021-07-25 DIAGNOSIS — Z658 Other specified problems related to psychosocial circumstances: Secondary | ICD-10-CM | POA: Diagnosis not present

## 2021-08-01 DIAGNOSIS — Z658 Other specified problems related to psychosocial circumstances: Secondary | ICD-10-CM | POA: Diagnosis not present

## 2021-08-01 DIAGNOSIS — R69 Illness, unspecified: Secondary | ICD-10-CM | POA: Diagnosis not present

## 2021-08-01 DIAGNOSIS — Z729 Problem related to lifestyle, unspecified: Secondary | ICD-10-CM | POA: Diagnosis not present

## 2021-08-01 DIAGNOSIS — Z600 Problems of adjustment to life-cycle transitions: Secondary | ICD-10-CM | POA: Diagnosis not present

## 2021-08-08 DIAGNOSIS — Z729 Problem related to lifestyle, unspecified: Secondary | ICD-10-CM | POA: Diagnosis not present

## 2021-08-08 DIAGNOSIS — Z658 Other specified problems related to psychosocial circumstances: Secondary | ICD-10-CM | POA: Diagnosis not present

## 2021-08-08 DIAGNOSIS — Z600 Problems of adjustment to life-cycle transitions: Secondary | ICD-10-CM | POA: Diagnosis not present

## 2021-08-08 DIAGNOSIS — R69 Illness, unspecified: Secondary | ICD-10-CM | POA: Diagnosis not present

## 2021-08-15 DIAGNOSIS — R69 Illness, unspecified: Secondary | ICD-10-CM | POA: Diagnosis not present

## 2021-08-15 DIAGNOSIS — Z600 Problems of adjustment to life-cycle transitions: Secondary | ICD-10-CM | POA: Diagnosis not present

## 2021-08-15 DIAGNOSIS — Z658 Other specified problems related to psychosocial circumstances: Secondary | ICD-10-CM | POA: Diagnosis not present

## 2021-08-15 DIAGNOSIS — Z729 Problem related to lifestyle, unspecified: Secondary | ICD-10-CM | POA: Diagnosis not present

## 2021-08-22 DIAGNOSIS — Z729 Problem related to lifestyle, unspecified: Secondary | ICD-10-CM | POA: Diagnosis not present

## 2021-08-22 DIAGNOSIS — Z658 Other specified problems related to psychosocial circumstances: Secondary | ICD-10-CM | POA: Diagnosis not present

## 2021-08-22 DIAGNOSIS — Z600 Problems of adjustment to life-cycle transitions: Secondary | ICD-10-CM | POA: Diagnosis not present

## 2021-08-22 DIAGNOSIS — R69 Illness, unspecified: Secondary | ICD-10-CM | POA: Diagnosis not present

## 2021-08-29 DIAGNOSIS — Z729 Problem related to lifestyle, unspecified: Secondary | ICD-10-CM | POA: Diagnosis not present

## 2021-08-29 DIAGNOSIS — Z569 Unspecified problems related to employment: Secondary | ICD-10-CM | POA: Diagnosis not present

## 2021-08-29 DIAGNOSIS — R69 Illness, unspecified: Secondary | ICD-10-CM | POA: Diagnosis not present

## 2021-08-29 DIAGNOSIS — Z600 Problems of adjustment to life-cycle transitions: Secondary | ICD-10-CM | POA: Diagnosis not present

## 2021-09-12 DIAGNOSIS — Z729 Problem related to lifestyle, unspecified: Secondary | ICD-10-CM | POA: Diagnosis not present

## 2021-09-12 DIAGNOSIS — R69 Illness, unspecified: Secondary | ICD-10-CM | POA: Diagnosis not present

## 2021-09-12 DIAGNOSIS — Z658 Other specified problems related to psychosocial circumstances: Secondary | ICD-10-CM | POA: Diagnosis not present

## 2021-09-12 DIAGNOSIS — Z600 Problems of adjustment to life-cycle transitions: Secondary | ICD-10-CM | POA: Diagnosis not present

## 2021-09-19 DIAGNOSIS — Z658 Other specified problems related to psychosocial circumstances: Secondary | ICD-10-CM | POA: Diagnosis not present

## 2021-09-19 DIAGNOSIS — Z729 Problem related to lifestyle, unspecified: Secondary | ICD-10-CM | POA: Diagnosis not present

## 2021-09-19 DIAGNOSIS — Z600 Problems of adjustment to life-cycle transitions: Secondary | ICD-10-CM | POA: Diagnosis not present

## 2021-09-19 DIAGNOSIS — R69 Illness, unspecified: Secondary | ICD-10-CM | POA: Diagnosis not present

## 2021-09-26 DIAGNOSIS — Z729 Problem related to lifestyle, unspecified: Secondary | ICD-10-CM | POA: Diagnosis not present

## 2021-09-26 DIAGNOSIS — R69 Illness, unspecified: Secondary | ICD-10-CM | POA: Diagnosis not present

## 2021-09-26 DIAGNOSIS — Z600 Problems of adjustment to life-cycle transitions: Secondary | ICD-10-CM | POA: Diagnosis not present

## 2021-09-26 DIAGNOSIS — Z658 Other specified problems related to psychosocial circumstances: Secondary | ICD-10-CM | POA: Diagnosis not present

## 2021-10-03 DIAGNOSIS — Z600 Problems of adjustment to life-cycle transitions: Secondary | ICD-10-CM | POA: Diagnosis not present

## 2021-10-03 DIAGNOSIS — Z729 Problem related to lifestyle, unspecified: Secondary | ICD-10-CM | POA: Diagnosis not present

## 2021-10-03 DIAGNOSIS — R69 Illness, unspecified: Secondary | ICD-10-CM | POA: Diagnosis not present

## 2021-10-03 DIAGNOSIS — Z658 Other specified problems related to psychosocial circumstances: Secondary | ICD-10-CM | POA: Diagnosis not present

## 2021-10-10 DIAGNOSIS — Z600 Problems of adjustment to life-cycle transitions: Secondary | ICD-10-CM | POA: Diagnosis not present

## 2021-10-10 DIAGNOSIS — R69 Illness, unspecified: Secondary | ICD-10-CM | POA: Diagnosis not present

## 2021-10-10 DIAGNOSIS — Z729 Problem related to lifestyle, unspecified: Secondary | ICD-10-CM | POA: Diagnosis not present

## 2021-10-10 DIAGNOSIS — Z658 Other specified problems related to psychosocial circumstances: Secondary | ICD-10-CM | POA: Diagnosis not present

## 2021-10-17 DIAGNOSIS — Z658 Other specified problems related to psychosocial circumstances: Secondary | ICD-10-CM | POA: Diagnosis not present

## 2021-10-17 DIAGNOSIS — Z600 Problems of adjustment to life-cycle transitions: Secondary | ICD-10-CM | POA: Diagnosis not present

## 2021-10-17 DIAGNOSIS — Z729 Problem related to lifestyle, unspecified: Secondary | ICD-10-CM | POA: Diagnosis not present

## 2021-10-17 DIAGNOSIS — R69 Illness, unspecified: Secondary | ICD-10-CM | POA: Diagnosis not present

## 2021-10-24 DIAGNOSIS — Z729 Problem related to lifestyle, unspecified: Secondary | ICD-10-CM | POA: Diagnosis not present

## 2021-10-24 DIAGNOSIS — Z600 Problems of adjustment to life-cycle transitions: Secondary | ICD-10-CM | POA: Diagnosis not present

## 2021-10-24 DIAGNOSIS — R69 Illness, unspecified: Secondary | ICD-10-CM | POA: Diagnosis not present

## 2021-10-24 DIAGNOSIS — Z658 Other specified problems related to psychosocial circumstances: Secondary | ICD-10-CM | POA: Diagnosis not present

## 2021-10-31 DIAGNOSIS — Z658 Other specified problems related to psychosocial circumstances: Secondary | ICD-10-CM | POA: Diagnosis not present

## 2021-10-31 DIAGNOSIS — Z600 Problems of adjustment to life-cycle transitions: Secondary | ICD-10-CM | POA: Diagnosis not present

## 2021-10-31 DIAGNOSIS — R69 Illness, unspecified: Secondary | ICD-10-CM | POA: Diagnosis not present

## 2021-10-31 DIAGNOSIS — Z729 Problem related to lifestyle, unspecified: Secondary | ICD-10-CM | POA: Diagnosis not present

## 2021-11-07 DIAGNOSIS — R69 Illness, unspecified: Secondary | ICD-10-CM | POA: Diagnosis not present

## 2021-11-07 DIAGNOSIS — Z729 Problem related to lifestyle, unspecified: Secondary | ICD-10-CM | POA: Diagnosis not present

## 2021-11-07 DIAGNOSIS — Z658 Other specified problems related to psychosocial circumstances: Secondary | ICD-10-CM | POA: Diagnosis not present

## 2021-11-14 DIAGNOSIS — Z658 Other specified problems related to psychosocial circumstances: Secondary | ICD-10-CM | POA: Diagnosis not present

## 2021-11-14 DIAGNOSIS — Z729 Problem related to lifestyle, unspecified: Secondary | ICD-10-CM | POA: Diagnosis not present

## 2021-11-14 DIAGNOSIS — R69 Illness, unspecified: Secondary | ICD-10-CM | POA: Diagnosis not present

## 2021-11-21 DIAGNOSIS — R69 Illness, unspecified: Secondary | ICD-10-CM | POA: Diagnosis not present

## 2021-11-21 DIAGNOSIS — Z658 Other specified problems related to psychosocial circumstances: Secondary | ICD-10-CM | POA: Diagnosis not present

## 2021-11-21 DIAGNOSIS — Z729 Problem related to lifestyle, unspecified: Secondary | ICD-10-CM | POA: Diagnosis not present

## 2021-11-28 DIAGNOSIS — Z658 Other specified problems related to psychosocial circumstances: Secondary | ICD-10-CM | POA: Diagnosis not present

## 2021-11-28 DIAGNOSIS — R69 Illness, unspecified: Secondary | ICD-10-CM | POA: Diagnosis not present

## 2021-11-28 DIAGNOSIS — Z600 Problems of adjustment to life-cycle transitions: Secondary | ICD-10-CM | POA: Diagnosis not present

## 2021-11-28 DIAGNOSIS — Z729 Problem related to lifestyle, unspecified: Secondary | ICD-10-CM | POA: Diagnosis not present

## 2021-12-05 DIAGNOSIS — Z658 Other specified problems related to psychosocial circumstances: Secondary | ICD-10-CM | POA: Diagnosis not present

## 2021-12-05 DIAGNOSIS — Z729 Problem related to lifestyle, unspecified: Secondary | ICD-10-CM | POA: Diagnosis not present

## 2021-12-05 DIAGNOSIS — R69 Illness, unspecified: Secondary | ICD-10-CM | POA: Diagnosis not present

## 2021-12-05 DIAGNOSIS — Z600 Problems of adjustment to life-cycle transitions: Secondary | ICD-10-CM | POA: Diagnosis not present

## 2021-12-12 DIAGNOSIS — R69 Illness, unspecified: Secondary | ICD-10-CM | POA: Diagnosis not present

## 2021-12-12 DIAGNOSIS — Z658 Other specified problems related to psychosocial circumstances: Secondary | ICD-10-CM | POA: Diagnosis not present

## 2021-12-12 DIAGNOSIS — Z729 Problem related to lifestyle, unspecified: Secondary | ICD-10-CM | POA: Diagnosis not present

## 2021-12-12 DIAGNOSIS — Z600 Problems of adjustment to life-cycle transitions: Secondary | ICD-10-CM | POA: Diagnosis not present

## 2021-12-19 DIAGNOSIS — Z600 Problems of adjustment to life-cycle transitions: Secondary | ICD-10-CM | POA: Diagnosis not present

## 2021-12-19 DIAGNOSIS — Z658 Other specified problems related to psychosocial circumstances: Secondary | ICD-10-CM | POA: Diagnosis not present

## 2021-12-19 DIAGNOSIS — R69 Illness, unspecified: Secondary | ICD-10-CM | POA: Diagnosis not present

## 2021-12-19 DIAGNOSIS — Z729 Problem related to lifestyle, unspecified: Secondary | ICD-10-CM | POA: Diagnosis not present

## 2021-12-26 DIAGNOSIS — Z569 Unspecified problems related to employment: Secondary | ICD-10-CM | POA: Diagnosis not present

## 2021-12-26 DIAGNOSIS — Z600 Problems of adjustment to life-cycle transitions: Secondary | ICD-10-CM | POA: Diagnosis not present

## 2021-12-26 DIAGNOSIS — Z658 Other specified problems related to psychosocial circumstances: Secondary | ICD-10-CM | POA: Diagnosis not present

## 2021-12-26 DIAGNOSIS — R69 Illness, unspecified: Secondary | ICD-10-CM | POA: Diagnosis not present

## 2022-01-02 DIAGNOSIS — R69 Illness, unspecified: Secondary | ICD-10-CM | POA: Diagnosis not present

## 2022-01-02 DIAGNOSIS — Z569 Unspecified problems related to employment: Secondary | ICD-10-CM | POA: Diagnosis not present

## 2022-01-02 DIAGNOSIS — Z658 Other specified problems related to psychosocial circumstances: Secondary | ICD-10-CM | POA: Diagnosis not present

## 2022-01-02 DIAGNOSIS — Z729 Problem related to lifestyle, unspecified: Secondary | ICD-10-CM | POA: Diagnosis not present

## 2022-01-05 DIAGNOSIS — R69 Illness, unspecified: Secondary | ICD-10-CM | POA: Diagnosis not present

## 2022-01-05 DIAGNOSIS — Z880 Allergy status to penicillin: Secondary | ICD-10-CM | POA: Diagnosis not present

## 2022-01-09 DIAGNOSIS — R69 Illness, unspecified: Secondary | ICD-10-CM | POA: Diagnosis not present

## 2022-01-09 DIAGNOSIS — Z729 Problem related to lifestyle, unspecified: Secondary | ICD-10-CM | POA: Diagnosis not present

## 2022-01-09 DIAGNOSIS — Z569 Unspecified problems related to employment: Secondary | ICD-10-CM | POA: Diagnosis not present

## 2022-01-16 DIAGNOSIS — R69 Illness, unspecified: Secondary | ICD-10-CM | POA: Diagnosis not present

## 2022-01-16 DIAGNOSIS — Z600 Problems of adjustment to life-cycle transitions: Secondary | ICD-10-CM | POA: Diagnosis not present

## 2022-01-16 DIAGNOSIS — Z658 Other specified problems related to psychosocial circumstances: Secondary | ICD-10-CM | POA: Diagnosis not present

## 2022-01-23 DIAGNOSIS — Z729 Problem related to lifestyle, unspecified: Secondary | ICD-10-CM | POA: Diagnosis not present

## 2022-01-23 DIAGNOSIS — R69 Illness, unspecified: Secondary | ICD-10-CM | POA: Diagnosis not present

## 2022-01-23 DIAGNOSIS — Z658 Other specified problems related to psychosocial circumstances: Secondary | ICD-10-CM | POA: Diagnosis not present

## 2022-01-23 DIAGNOSIS — Z600 Problems of adjustment to life-cycle transitions: Secondary | ICD-10-CM | POA: Diagnosis not present

## 2022-01-30 DIAGNOSIS — Z729 Problem related to lifestyle, unspecified: Secondary | ICD-10-CM | POA: Diagnosis not present

## 2022-01-30 DIAGNOSIS — R69 Illness, unspecified: Secondary | ICD-10-CM | POA: Diagnosis not present

## 2022-01-30 DIAGNOSIS — Z600 Problems of adjustment to life-cycle transitions: Secondary | ICD-10-CM | POA: Diagnosis not present

## 2022-01-30 DIAGNOSIS — Z658 Other specified problems related to psychosocial circumstances: Secondary | ICD-10-CM | POA: Diagnosis not present

## 2022-02-06 DIAGNOSIS — Z600 Problems of adjustment to life-cycle transitions: Secondary | ICD-10-CM | POA: Diagnosis not present

## 2022-02-06 DIAGNOSIS — R69 Illness, unspecified: Secondary | ICD-10-CM | POA: Diagnosis not present

## 2022-02-06 DIAGNOSIS — Z658 Other specified problems related to psychosocial circumstances: Secondary | ICD-10-CM | POA: Diagnosis not present

## 2022-02-13 DIAGNOSIS — Z658 Other specified problems related to psychosocial circumstances: Secondary | ICD-10-CM | POA: Diagnosis not present

## 2022-02-13 DIAGNOSIS — Z729 Problem related to lifestyle, unspecified: Secondary | ICD-10-CM | POA: Diagnosis not present

## 2022-02-13 DIAGNOSIS — R69 Illness, unspecified: Secondary | ICD-10-CM | POA: Diagnosis not present

## 2022-02-13 DIAGNOSIS — Z600 Problems of adjustment to life-cycle transitions: Secondary | ICD-10-CM | POA: Diagnosis not present

## 2022-02-21 DIAGNOSIS — Z569 Unspecified problems related to employment: Secondary | ICD-10-CM | POA: Diagnosis not present

## 2022-02-21 DIAGNOSIS — R69 Illness, unspecified: Secondary | ICD-10-CM | POA: Diagnosis not present

## 2022-02-21 DIAGNOSIS — Z658 Other specified problems related to psychosocial circumstances: Secondary | ICD-10-CM | POA: Diagnosis not present

## 2022-02-21 DIAGNOSIS — Z729 Problem related to lifestyle, unspecified: Secondary | ICD-10-CM | POA: Diagnosis not present

## 2022-02-27 DIAGNOSIS — Z600 Problems of adjustment to life-cycle transitions: Secondary | ICD-10-CM | POA: Diagnosis not present

## 2022-02-27 DIAGNOSIS — R69 Illness, unspecified: Secondary | ICD-10-CM | POA: Diagnosis not present

## 2022-02-27 DIAGNOSIS — Z658 Other specified problems related to psychosocial circumstances: Secondary | ICD-10-CM | POA: Diagnosis not present

## 2022-02-27 DIAGNOSIS — Z729 Problem related to lifestyle, unspecified: Secondary | ICD-10-CM | POA: Diagnosis not present

## 2022-03-05 ENCOUNTER — Ambulatory Visit (HOSPITAL_COMMUNITY): Payer: 59 | Attending: Preventative Medicine

## 2022-03-05 DIAGNOSIS — M545 Low back pain, unspecified: Secondary | ICD-10-CM | POA: Diagnosis not present

## 2022-03-05 NOTE — Therapy (Signed)
OUTPATIENT PHYSICAL THERAPY THORACOLUMBAR EVALUATION   Patient Name: Erik Nichols MRN: [id number] DOB:[birth year], 31 y.o., male Today's Date: 03/05/2022  END OF SESSION:  PT End of Session - 03/05/22 1119     Visit Number 1    Number of Visits 3    Date for PT Re-Evaluation 03/28/22    Authorization Type [employer]    PT Start Time 1120    PT Stop Time 1200    PT Time Calculation (min) 40 min             No past medical history on file. No past surgical history on file. There are no problems to display for this patient.   PCP: none  REFERRING PROVIDER: Laverle Hobby, MD  REFERRING DIAG: low back pain  Rationale for Evaluation and Treatment: Rehabilitation  THERAPY DIAG:  Low back pain, unspecified back pain laterality, unspecified chronicity, unspecified whether sciatica present  ONSET DATE: 6 months to a year ago  SUBJECTIVE:                                                                                                                                                                                           SUBJECTIVE STATEMENT: Lifting something heavy with a co-worker and felt a "pop" or pull; stiffened up later that day.  Took a few days off work and it got better.  Since then has tweaked it again a couple times; seems to keep happening but no pain today or for the last 2 weeks or so.   PERTINENT HISTORY:  none  PAIN:  Are you having pain? Yes: NPRS scale: 0 today; when injured 3-4/10 Pain location: low back Pain description: uncomfortable, sore Aggravating factors: try to bend lift or move something heavy Relieving factors: heat, ice, ibuprofen, rest  PRECAUTIONS: None  WEIGHT BEARING RESTRICTIONS: No  FALLS:  Has patient fallen in last 6 months? 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  PLOF: Independent  PATIENT GOALS: keep from hurting my back again  NEXT MD VISIT:   OBJECTIVE:    DIAGNOSTIC FINDINGS:  none  PATIENT SURVEYS:  FOTO 72   COGNITION: Overall cognitive status: Within functional limits for tasks assessed     SENSATION: WFL  MUSCLE LENGTH: Hamstrings: Right ~60 deg; Left ~50 deg  POSTURE: rounded shoulders, forward head, and posterior pelvic tilt  PALPATION: No palpable tenderness today  LUMBAR ROM:   AROM eval  Flexion 80% available  Extension 70% available  Right lateral flexion   Left lateral flexion   Right rotation   Left rotation    (Blank  rows = not tested)    LOWER EXTREMITY MMT:    MMT Right eval Left eval  Hip flexion 5 5  Hip extension 4+ 4+  Hip abduction    Hip adduction    Hip internal rotation    Hip external rotation    Knee flexion    Knee extension 5 5  Ankle dorsiflexion 5 5  Ankle plantarflexion    Ankle inversion    Ankle eversion     (Blank rows = not tested)    TODAY'S TREATMENT:                                                                                                                              DATE: 03/05/22 Physical therapy evaluation and HEP instruction    PATIENT EDUCATION:  Education details: Patient educated on exam findings, POC, scope of PT, HEP, and good sitting posture. Person educated: Patient Education method: Explanation, Demonstration, and Handouts Education comprehension: verbalized understanding, returned demonstration, verbal cues required, and tactile cues required   HOME EXERCISE PROGRAM: Access Code: [email or web address] URL: [email or web address] Date: 03/05/2022 Prepared by: [facility] - Rehab  Exercises - Correct Seated Posture  - 1 x daily - 7 x weekly - 1 sets - 1 reps - Prone Press Up  - 3 x daily - 7 x weekly - 1 sets - 10 reps - Supine Bridge  - 2 x daily - 7 x weekly - 1 sets - 10 reps - Supine Transversus Abdominis Bracing - Hands on Stomach  - 2 x daily - 7 x weekly - 1 sets - 10 reps - Supine Dead Bug with Leg Extension  - 2 x daily - 7 x weekly -  2 sets - 10 reps  ASSESSMENT:  CLINICAL IMPRESSION: Patient is a 31 y.o. male  who was seen today for physical therapy evaluation and treatment for low back pain.  Patient demonstrates mild muscle weakness, reduced ROM, and fascial restrictions which are likely contributing to symptoms of pain and are negatively impacting patient ability to perform ADLs and functional mobility tasks. Patient will benefit from skilled physical therapy services to address these deficits to reduce pain and improve level of function with ADLs and functional mobility tasks.   OBJECTIVE IMPAIRMENTS: decreased activity tolerance, decreased knowledge of condition, decreased mobility, decreased ROM, decreased strength, increased fascial restrictions, impaired perceived functional ability, postural dysfunction, and pain.   ACTIVITY LIMITATIONS: carrying, lifting, bending, sitting, standing, and squatting  PARTICIPATION LIMITATIONS: community activity and occupation   REHAB POTENTIAL: Excellent  CLINICAL DECISION MAKING: Stable/uncomplicated  EVALUATION COMPLEXITY: Low   GOALS: Goals reviewed with patient? No  SHORT TERM GOALS: Target date: 03/19/2022  Patient will be independent with initial HEP  Baseline: Goal status: INITIAL  2.  Patient will increase left leg MMTs to 5/5 without pain to promote return to ambulation community distances with minimal deviation.  Baseline:  Goal status: INITIAL   LONG TERM GOALS: Target date: 03/26/2022  Patient will be independent in self management strategies to improve quality of life and functional outcomes.   Baseline:  Goal status: INITIAL  2.  Patient will improve FOTO score to predicted value  Baseline:  Goal status: INITIAL  3.  Patient will demonstrate good sitting posture x 10 minutes without cues to help prevent back pain Baseline:  Goal status: INITIAL  4.  Patient will demonstrate good lifting technique to avoid any further back  injury Baseline:  Goal status: INITIAL  PLAN:  PT FREQUENCY: 1x/week  PT DURATION: 4 weeks  PLANNED INTERVENTIONS: . Therapeutic exercises, Therapeutic activity, Neuromuscular re-education, Balance training, Gait training, Patient/Family education, Joint manipulation, Joint mobilization, Stair training, Orthotic/Fit training, DME instructions, Aquatic Therapy, Dry Needling, Electrical stimulation, Spinal manipulation, Spinal mobilization, Cryotherapy, Moist heat, Compression bandaging, scar mobilization, Splintting, Taping, Traction, Ultrasound, Ionotophoresis 4mg /ml Dexamethasone, and Manual therapy   PLAN FOR NEXT SESSION: Review HEP and goals; lifting techniques; core strengthening   1:18 PM, 03/05/22 Elman Dettman Small Javaughn Opdahl MPT [facility] [place] [phone] [id number]

## 2022-03-06 DIAGNOSIS — Z729 Problem related to lifestyle, unspecified: Secondary | ICD-10-CM | POA: Diagnosis not present

## 2022-03-06 DIAGNOSIS — R69 Illness, unspecified: Secondary | ICD-10-CM | POA: Diagnosis not present

## 2022-03-06 DIAGNOSIS — Z658 Other specified problems related to psychosocial circumstances: Secondary | ICD-10-CM | POA: Diagnosis not present

## 2022-03-13 DIAGNOSIS — Z658 Other specified problems related to psychosocial circumstances: Secondary | ICD-10-CM | POA: Diagnosis not present

## 2022-03-13 DIAGNOSIS — Z600 Problems of adjustment to life-cycle transitions: Secondary | ICD-10-CM | POA: Diagnosis not present

## 2022-03-13 DIAGNOSIS — R69 Illness, unspecified: Secondary | ICD-10-CM | POA: Diagnosis not present

## 2022-03-19 ENCOUNTER — Ambulatory Visit (HOSPITAL_COMMUNITY): Payer: 59 | Admitting: Physical Therapy

## 2022-03-19 DIAGNOSIS — M545 Low back pain, unspecified: Secondary | ICD-10-CM | POA: Diagnosis not present

## 2022-03-19 NOTE — Therapy (Signed)
OUTPATIENT PHYSICAL THERAPY THORACOLUMBAR EVALUATION   Patient Name: Erik Nichols MRN: [id number] DOB:May 09, 1990, 31 y.o., male Today's Date: 03/19/2022  PHYSICAL THERAPY DISCHARGE SUMMARY  Visits from Start of Care: 2  Current functional level related to goals / functional outcomes: No pain    Remaining deficits: N/a   Education / Equipment: HEP   Patient agrees to discharge. Patient goals were met. Patient is being discharged due to being pleased with the current functional level.  END OF SESSION:  PT End of Session - 03/19/22 0907     Visit Number 2    Number of Visits 2    Date for PT Re-Evaluation 03/28/22    Authorization Type [employer]et[employer]    PT Start Time 0825    PT Stop Time 0904    PT Time Calculation (min) 39 min              No past medical history on file. No past surgical history on file. There are no problems to display for this patient.   PCP: none  REFERRING PROVIDER: Leisa Lenz, MD  REFERRING DIAG: low back pain  Rationale for Evaluation and Treatment: Rehabilitation  THERAPY DIAG:  Low back pain, unspecified back pain laterality, unspecified chronicity, unspecified whether sciatica present  ONSET DATE: 6 months to a year ago  SUBJECTIVE:                                                                                                                                                                                           SUBJECTIVE STATEMENT: Pt states that his back is fine now but it seems like something so little will flare it up. He is not having any pain today.  Pt feels that today can be his last day.   PERTINENT HISTORY:  none  PAIN:  Are you having pain? Yes: NPRS scale: 0 today; when injured 0/10 Pain location: low back Pain description: uncomfortable, sore Aggravating factors: try to bend lift or move something heavy Relieving factors: heat, ice, ibuprofen, rest  PRECAUTIONS: None  WEIGHT BEARING RESTRICTIONS:  No  FALLS:  Has patient fallen in last 6 months? 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  PLOF: Independent  PATIENT GOALS: keep from hurting my back again  NEXT MD VISIT:   OBJECTIVE:   DIAGNOSTIC FINDINGS:  none  PATIENT SURVEYS:  FOTO 72   COGNITION: Overall cognitive status: Within functional limits for tasks assessed     SENSATION: WFL  MUSCLE LENGTH: Hamstrings: Right ~60 deg; Left ~50 deg  POSTURE: rounded shoulders, forward head, and posterior pelvic tilt  PALPATION: No palpable tenderness today  LUMBAR ROM:   AROM eval 12/20  Flexion 80% available wfl  Extension 70% available wfl  Right lateral flexion    Left lateral flexion    Right rotation    Left rotation     (Blank rows = not tested)    LOWER EXTREMITY MMT:    MMT Right eval Left eval  Hip flexion 5 5  Hip extension 4+ 4+  Hip abduction    Hip adduction    Hip internal rotation    Hip external rotation    Knee flexion    Knee extension 5 5  Ankle dorsiflexion 5 5  Ankle plantarflexion    Ankle inversion    Ankle eversion     (Blank rows = not tested)    TODAY'S TREATMENT:   12/20 Standing: Hip excursion x 3 Heel raises x 10 Functional squat: 10   Supine:   Knee to chest 3 x 30" Active hamstring stretch 3 x 30"  Bridge x 10      Prone: Press up x 10 Single leg raise x 10      Opposite arm/leg x 5                                                                                                               DATE: 03/05/22 Physical therapy evaluation and HEP instruction    PATIENT EDUCATION:  Education details: Patient educated on exam findings, POC, scope of PT, HEP, and good sitting posture. Person educated: Patient Education method: Explanation, Demonstration, and Handouts Education comprehension: verbalized understanding, returned demonstration, verbal cues required, and tactile cues required   HOME  EXERCISE PROGRAM: Access Code: [email or web address] URL: [email or web address] 03/19/22 - Supine Single Knee to Chest Stretch  - 2 x daily - 7 x weekly - 1 sets - 10 reps - 30" hold - Hooklying Active Hamstring Stretch  - 2 x daily - 7 x weekly - 1 sets - 10 reps - 30" hold - Prone Hip Extension  - 2 x daily - 7 x weekly - 1 sets - 10 reps - 30" hold Date: 03/05/2022 Prepared by: AP - Rehab  Exercises - Correct Seated Posture  - 1 x daily - 7 x weekly - 1 sets - 1 reps - Prone Press Up  - 3 x daily - 7 x weekly - 1 sets - 10 reps - Supine Bridge  - 2 x daily - 7 x weekly - 1 sets - 10 reps - Supine Transversus Abdominis Bracing - Hands on Stomach  - 2 x daily - 7 x weekly - 1 sets - 10 reps - Supine Dead Bug with Leg Extension  - 2 x daily - 7 x weekly - 2 sets - 10 reps  ASSESSMENT:  CLINICAL IMPRESSION: PT states that he has not had any pain in a week.  He does the HEP periodically.  He feels that he is ready to be discharge as he just came to get some exercises and feels that he  has a grasp on what he needs to do now.  PT HEP updated and Pt discharged to HEP  OBJECTIVE IMPAIRMENTS: decreased activity tolerance, decreased knowledge of condition, decreased mobility, decreased ROM, decreased strength, increased fascial restrictions, impaired perceived functional ability, postural dysfunction, and pain.   ACTIVITY LIMITATIONS: carrying, lifting, bending, sitting, standing, and squatting  PARTICIPATION LIMITATIONS: community activity and occupation   REHAB POTENTIAL: Excellent  CLINICAL DECISION MAKING: Stable/uncomplicated  EVALUATION COMPLEXITY: Low   GOALS: Goals reviewed with patient? No  SHORT TERM GOALS: Target date: 03/19/2022  Patient will be independent with initial HEP  Baseline: Goal status: MET  2.  Patient will increase left leg MMTs to 5/5 without pain to promote return to ambulation community distances with minimal deviation.  Baseline:  Goal status:  progressing    LONG TERM GOALS: Target date: 03/26/2022  Patient will be independent in self management strategies to improve quality of life and functional outcomes.   Baseline:  Goal status: met   2.  Patient will improve FOTO score to predicted value  Baseline:  Goal status: not taken as pt did not voice wanting to be discharged until end of session   3.  Patient will demonstrate good sitting posture x 10 minutes without cues to help prevent back pain Baseline:  Goal status: not seen but verbalized ability   4.  Patient will demonstrate good lifting technique to avoid any further back injury Baseline:  Goal status: MET  PLAN:  PT FREQUENCY: 1x/week  PT DURATION: 4 weeks  PLANNED INTERVENTIONS: . Therapeutic exercises, Therapeutic activity, Neuromuscular re-education, Balance training, Gait training, Patient/Family education, Joint manipulation, Joint mobilization, Stair training, Orthotic/Fit training, DME instructions, Aquatic Therapy, Dry Needling, Electrical stimulation, Spinal manipulation, Spinal mobilization, Cryotherapy, Moist heat, Compression bandaging, scar mobilization, Splintting, Taping, Traction, Ultrasound, Ionotophoresis 61m/ml Dexamethasone, and Manual therapy   PLAN FOR NEXT SESSION: Discharge  CRayetta Humphrey PT CLT [phone] 9:12 AM

## 2022-03-20 DIAGNOSIS — Z729 Problem related to lifestyle, unspecified: Secondary | ICD-10-CM | POA: Diagnosis not present

## 2022-03-20 DIAGNOSIS — R69 Illness, unspecified: Secondary | ICD-10-CM | POA: Diagnosis not present

## 2022-03-20 DIAGNOSIS — Z569 Unspecified problems related to employment: Secondary | ICD-10-CM | POA: Diagnosis not present

## 2022-03-20 DIAGNOSIS — Z658 Other specified problems related to psychosocial circumstances: Secondary | ICD-10-CM | POA: Diagnosis not present

## 2022-03-27 DIAGNOSIS — Z658 Other specified problems related to psychosocial circumstances: Secondary | ICD-10-CM | POA: Diagnosis not present

## 2022-03-27 DIAGNOSIS — Z600 Problems of adjustment to life-cycle transitions: Secondary | ICD-10-CM | POA: Diagnosis not present

## 2022-03-27 DIAGNOSIS — R69 Illness, unspecified: Secondary | ICD-10-CM | POA: Diagnosis not present

## 2022-03-27 DIAGNOSIS — Z729 Problem related to lifestyle, unspecified: Secondary | ICD-10-CM | POA: Diagnosis not present

## 2022-04-02 ENCOUNTER — Encounter (HOSPITAL_COMMUNITY): Payer: 59

## 2022-04-03 DIAGNOSIS — Z729 Problem related to lifestyle, unspecified: Secondary | ICD-10-CM | POA: Diagnosis not present

## 2022-04-03 DIAGNOSIS — Z658 Other specified problems related to psychosocial circumstances: Secondary | ICD-10-CM | POA: Diagnosis not present

## 2022-04-03 DIAGNOSIS — Z600 Problems of adjustment to life-cycle transitions: Secondary | ICD-10-CM | POA: Diagnosis not present

## 2022-04-03 DIAGNOSIS — R69 Illness, unspecified: Secondary | ICD-10-CM | POA: Diagnosis not present

## 2022-04-10 DIAGNOSIS — Z658 Other specified problems related to psychosocial circumstances: Secondary | ICD-10-CM | POA: Diagnosis not present

## 2022-04-10 DIAGNOSIS — R69 Illness, unspecified: Secondary | ICD-10-CM | POA: Diagnosis not present

## 2022-04-10 DIAGNOSIS — Z729 Problem related to lifestyle, unspecified: Secondary | ICD-10-CM | POA: Diagnosis not present

## 2022-04-17 DIAGNOSIS — Z600 Problems of adjustment to life-cycle transitions: Secondary | ICD-10-CM | POA: Diagnosis not present

## 2022-04-17 DIAGNOSIS — Z729 Problem related to lifestyle, unspecified: Secondary | ICD-10-CM | POA: Diagnosis not present

## 2022-04-17 DIAGNOSIS — R69 Illness, unspecified: Secondary | ICD-10-CM | POA: Diagnosis not present

## 2022-04-17 DIAGNOSIS — Z658 Other specified problems related to psychosocial circumstances: Secondary | ICD-10-CM | POA: Diagnosis not present

## 2022-04-24 DIAGNOSIS — R69 Illness, unspecified: Secondary | ICD-10-CM | POA: Diagnosis not present

## 2022-04-24 DIAGNOSIS — Z658 Other specified problems related to psychosocial circumstances: Secondary | ICD-10-CM | POA: Diagnosis not present

## 2022-04-24 DIAGNOSIS — Z600 Problems of adjustment to life-cycle transitions: Secondary | ICD-10-CM | POA: Diagnosis not present

## 2022-04-24 DIAGNOSIS — Z729 Problem related to lifestyle, unspecified: Secondary | ICD-10-CM | POA: Diagnosis not present

## 2022-05-01 DIAGNOSIS — Z729 Problem related to lifestyle, unspecified: Secondary | ICD-10-CM | POA: Diagnosis not present

## 2022-05-01 DIAGNOSIS — Z600 Problems of adjustment to life-cycle transitions: Secondary | ICD-10-CM | POA: Diagnosis not present

## 2022-05-01 DIAGNOSIS — Z658 Other specified problems related to psychosocial circumstances: Secondary | ICD-10-CM | POA: Diagnosis not present

## 2022-05-01 DIAGNOSIS — R69 Illness, unspecified: Secondary | ICD-10-CM | POA: Diagnosis not present

## 2022-05-08 DIAGNOSIS — Z609 Problem related to social environment, unspecified: Secondary | ICD-10-CM | POA: Diagnosis not present

## 2022-05-08 DIAGNOSIS — R69 Illness, unspecified: Secondary | ICD-10-CM | POA: Diagnosis not present

## 2022-05-08 DIAGNOSIS — Z658 Other specified problems related to psychosocial circumstances: Secondary | ICD-10-CM | POA: Diagnosis not present

## 2022-05-15 DIAGNOSIS — Z729 Problem related to lifestyle, unspecified: Secondary | ICD-10-CM | POA: Diagnosis not present

## 2022-05-15 DIAGNOSIS — Z658 Other specified problems related to psychosocial circumstances: Secondary | ICD-10-CM | POA: Diagnosis not present

## 2022-05-15 DIAGNOSIS — Z600 Problems of adjustment to life-cycle transitions: Secondary | ICD-10-CM | POA: Diagnosis not present

## 2022-05-15 DIAGNOSIS — R69 Illness, unspecified: Secondary | ICD-10-CM | POA: Diagnosis not present

## 2022-05-22 DIAGNOSIS — Z729 Problem related to lifestyle, unspecified: Secondary | ICD-10-CM | POA: Diagnosis not present

## 2022-05-22 DIAGNOSIS — Z658 Other specified problems related to psychosocial circumstances: Secondary | ICD-10-CM | POA: Diagnosis not present

## 2022-05-22 DIAGNOSIS — R69 Illness, unspecified: Secondary | ICD-10-CM | POA: Diagnosis not present

## 2022-05-29 DIAGNOSIS — F341 Dysthymic disorder: Secondary | ICD-10-CM | POA: Diagnosis not present

## 2022-05-29 DIAGNOSIS — Z729 Problem related to lifestyle, unspecified: Secondary | ICD-10-CM | POA: Diagnosis not present

## 2022-05-29 DIAGNOSIS — Z569 Unspecified problems related to employment: Secondary | ICD-10-CM | POA: Diagnosis not present

## 2022-05-29 DIAGNOSIS — Z600 Problems of adjustment to life-cycle transitions: Secondary | ICD-10-CM | POA: Diagnosis not present

## 2022-06-05 DIAGNOSIS — Z600 Problems of adjustment to life-cycle transitions: Secondary | ICD-10-CM | POA: Diagnosis not present

## 2022-06-05 DIAGNOSIS — Z658 Other specified problems related to psychosocial circumstances: Secondary | ICD-10-CM | POA: Diagnosis not present

## 2022-06-05 DIAGNOSIS — Z729 Problem related to lifestyle, unspecified: Secondary | ICD-10-CM | POA: Diagnosis not present

## 2022-06-05 DIAGNOSIS — F341 Dysthymic disorder: Secondary | ICD-10-CM | POA: Diagnosis not present

## 2022-06-12 DIAGNOSIS — Z729 Problem related to lifestyle, unspecified: Secondary | ICD-10-CM | POA: Diagnosis not present

## 2022-06-12 DIAGNOSIS — F341 Dysthymic disorder: Secondary | ICD-10-CM | POA: Diagnosis not present

## 2022-06-12 DIAGNOSIS — Z600 Problems of adjustment to life-cycle transitions: Secondary | ICD-10-CM | POA: Diagnosis not present

## 2022-06-12 DIAGNOSIS — Z658 Other specified problems related to psychosocial circumstances: Secondary | ICD-10-CM | POA: Diagnosis not present

## 2022-06-19 DIAGNOSIS — F341 Dysthymic disorder: Secondary | ICD-10-CM | POA: Diagnosis not present

## 2022-06-19 DIAGNOSIS — Z600 Problems of adjustment to life-cycle transitions: Secondary | ICD-10-CM | POA: Diagnosis not present

## 2022-06-19 DIAGNOSIS — Z658 Other specified problems related to psychosocial circumstances: Secondary | ICD-10-CM | POA: Diagnosis not present

## 2022-06-19 DIAGNOSIS — Z729 Problem related to lifestyle, unspecified: Secondary | ICD-10-CM | POA: Diagnosis not present

## 2022-06-26 DIAGNOSIS — Z600 Problems of adjustment to life-cycle transitions: Secondary | ICD-10-CM | POA: Diagnosis not present

## 2022-06-26 DIAGNOSIS — F341 Dysthymic disorder: Secondary | ICD-10-CM | POA: Diagnosis not present

## 2022-06-26 DIAGNOSIS — Z658 Other specified problems related to psychosocial circumstances: Secondary | ICD-10-CM | POA: Diagnosis not present

## 2022-06-26 DIAGNOSIS — Z609 Problem related to social environment, unspecified: Secondary | ICD-10-CM | POA: Diagnosis not present

## 2022-07-03 DIAGNOSIS — Z600 Problems of adjustment to life-cycle transitions: Secondary | ICD-10-CM | POA: Diagnosis not present

## 2022-07-03 DIAGNOSIS — Z729 Problem related to lifestyle, unspecified: Secondary | ICD-10-CM | POA: Diagnosis not present

## 2022-07-03 DIAGNOSIS — F341 Dysthymic disorder: Secondary | ICD-10-CM | POA: Diagnosis not present

## 2022-07-03 DIAGNOSIS — Z658 Other specified problems related to psychosocial circumstances: Secondary | ICD-10-CM | POA: Diagnosis not present

## 2022-07-10 DIAGNOSIS — Z658 Other specified problems related to psychosocial circumstances: Secondary | ICD-10-CM | POA: Diagnosis not present

## 2022-07-10 DIAGNOSIS — Z600 Problems of adjustment to life-cycle transitions: Secondary | ICD-10-CM | POA: Diagnosis not present

## 2022-07-10 DIAGNOSIS — F341 Dysthymic disorder: Secondary | ICD-10-CM | POA: Diagnosis not present

## 2022-07-10 DIAGNOSIS — Z569 Unspecified problems related to employment: Secondary | ICD-10-CM | POA: Diagnosis not present

## 2022-07-17 DIAGNOSIS — Z569 Unspecified problems related to employment: Secondary | ICD-10-CM | POA: Diagnosis not present

## 2022-07-17 DIAGNOSIS — Z658 Other specified problems related to psychosocial circumstances: Secondary | ICD-10-CM | POA: Diagnosis not present

## 2022-07-17 DIAGNOSIS — F341 Dysthymic disorder: Secondary | ICD-10-CM | POA: Diagnosis not present

## 2022-07-18 DIAGNOSIS — F411 Generalized anxiety disorder: Secondary | ICD-10-CM | POA: Diagnosis not present

## 2022-07-24 DIAGNOSIS — F341 Dysthymic disorder: Secondary | ICD-10-CM | POA: Diagnosis not present

## 2022-07-24 DIAGNOSIS — Z729 Problem related to lifestyle, unspecified: Secondary | ICD-10-CM | POA: Diagnosis not present

## 2022-07-24 DIAGNOSIS — Z658 Other specified problems related to psychosocial circumstances: Secondary | ICD-10-CM | POA: Diagnosis not present

## 2022-07-24 DIAGNOSIS — Z600 Problems of adjustment to life-cycle transitions: Secondary | ICD-10-CM | POA: Diagnosis not present

## 2022-07-31 DIAGNOSIS — Z600 Problems of adjustment to life-cycle transitions: Secondary | ICD-10-CM | POA: Diagnosis not present

## 2022-07-31 DIAGNOSIS — F341 Dysthymic disorder: Secondary | ICD-10-CM | POA: Diagnosis not present

## 2022-07-31 DIAGNOSIS — Z658 Other specified problems related to psychosocial circumstances: Secondary | ICD-10-CM | POA: Diagnosis not present

## 2022-08-07 DIAGNOSIS — Z729 Problem related to lifestyle, unspecified: Secondary | ICD-10-CM | POA: Diagnosis not present

## 2022-08-07 DIAGNOSIS — Z658 Other specified problems related to psychosocial circumstances: Secondary | ICD-10-CM | POA: Diagnosis not present

## 2022-08-07 DIAGNOSIS — Z600 Problems of adjustment to life-cycle transitions: Secondary | ICD-10-CM | POA: Diagnosis not present

## 2022-08-07 DIAGNOSIS — F341 Dysthymic disorder: Secondary | ICD-10-CM | POA: Diagnosis not present

## 2022-08-14 DIAGNOSIS — F341 Dysthymic disorder: Secondary | ICD-10-CM | POA: Diagnosis not present

## 2022-08-14 DIAGNOSIS — Z658 Other specified problems related to psychosocial circumstances: Secondary | ICD-10-CM | POA: Diagnosis not present

## 2022-08-14 DIAGNOSIS — Z609 Problem related to social environment, unspecified: Secondary | ICD-10-CM | POA: Diagnosis not present

## 2022-08-14 DIAGNOSIS — Z600 Problems of adjustment to life-cycle transitions: Secondary | ICD-10-CM | POA: Diagnosis not present

## 2022-08-21 DIAGNOSIS — Z658 Other specified problems related to psychosocial circumstances: Secondary | ICD-10-CM | POA: Diagnosis not present

## 2022-08-21 DIAGNOSIS — F341 Dysthymic disorder: Secondary | ICD-10-CM | POA: Diagnosis not present

## 2022-08-28 DIAGNOSIS — Z658 Other specified problems related to psychosocial circumstances: Secondary | ICD-10-CM | POA: Diagnosis not present

## 2022-08-28 DIAGNOSIS — F341 Dysthymic disorder: Secondary | ICD-10-CM | POA: Diagnosis not present

## 2022-08-28 DIAGNOSIS — Z600 Problems of adjustment to life-cycle transitions: Secondary | ICD-10-CM | POA: Diagnosis not present

## 2022-09-04 DIAGNOSIS — Z600 Problems of adjustment to life-cycle transitions: Secondary | ICD-10-CM | POA: Diagnosis not present

## 2022-09-04 DIAGNOSIS — F341 Dysthymic disorder: Secondary | ICD-10-CM | POA: Diagnosis not present

## 2022-09-04 DIAGNOSIS — Z658 Other specified problems related to psychosocial circumstances: Secondary | ICD-10-CM | POA: Diagnosis not present

## 2022-09-11 DIAGNOSIS — F341 Dysthymic disorder: Secondary | ICD-10-CM | POA: Diagnosis not present

## 2022-09-11 DIAGNOSIS — Z600 Problems of adjustment to life-cycle transitions: Secondary | ICD-10-CM | POA: Diagnosis not present

## 2022-09-11 DIAGNOSIS — Z658 Other specified problems related to psychosocial circumstances: Secondary | ICD-10-CM | POA: Diagnosis not present

## 2022-09-18 DIAGNOSIS — F341 Dysthymic disorder: Secondary | ICD-10-CM | POA: Diagnosis not present

## 2022-09-18 DIAGNOSIS — Z600 Problems of adjustment to life-cycle transitions: Secondary | ICD-10-CM | POA: Diagnosis not present

## 2022-09-18 DIAGNOSIS — Z658 Other specified problems related to psychosocial circumstances: Secondary | ICD-10-CM | POA: Diagnosis not present

## 2022-09-25 DIAGNOSIS — Z600 Problems of adjustment to life-cycle transitions: Secondary | ICD-10-CM | POA: Diagnosis not present

## 2022-09-25 DIAGNOSIS — F341 Dysthymic disorder: Secondary | ICD-10-CM | POA: Diagnosis not present

## 2022-09-25 DIAGNOSIS — Z658 Other specified problems related to psychosocial circumstances: Secondary | ICD-10-CM | POA: Diagnosis not present

## 2022-10-02 DIAGNOSIS — F341 Dysthymic disorder: Secondary | ICD-10-CM | POA: Diagnosis not present

## 2022-10-02 DIAGNOSIS — Z658 Other specified problems related to psychosocial circumstances: Secondary | ICD-10-CM | POA: Diagnosis not present

## 2022-10-02 DIAGNOSIS — Z600 Problems of adjustment to life-cycle transitions: Secondary | ICD-10-CM | POA: Diagnosis not present

## 2022-10-09 DIAGNOSIS — F341 Dysthymic disorder: Secondary | ICD-10-CM | POA: Diagnosis not present

## 2022-10-09 DIAGNOSIS — Z658 Other specified problems related to psychosocial circumstances: Secondary | ICD-10-CM | POA: Diagnosis not present

## 2022-10-09 DIAGNOSIS — Z600 Problems of adjustment to life-cycle transitions: Secondary | ICD-10-CM | POA: Diagnosis not present

## 2022-10-16 DIAGNOSIS — Z729 Problem related to lifestyle, unspecified: Secondary | ICD-10-CM | POA: Diagnosis not present

## 2022-10-16 DIAGNOSIS — F341 Dysthymic disorder: Secondary | ICD-10-CM | POA: Diagnosis not present

## 2022-10-16 DIAGNOSIS — Z609 Problem related to social environment, unspecified: Secondary | ICD-10-CM | POA: Diagnosis not present

## 2022-10-23 DIAGNOSIS — Z658 Other specified problems related to psychosocial circumstances: Secondary | ICD-10-CM | POA: Diagnosis not present

## 2022-10-23 DIAGNOSIS — F341 Dysthymic disorder: Secondary | ICD-10-CM | POA: Diagnosis not present

## 2022-10-23 DIAGNOSIS — Z609 Problem related to social environment, unspecified: Secondary | ICD-10-CM | POA: Diagnosis not present

## 2022-10-30 DIAGNOSIS — Z600 Problems of adjustment to life-cycle transitions: Secondary | ICD-10-CM | POA: Diagnosis not present

## 2022-10-30 DIAGNOSIS — F341 Dysthymic disorder: Secondary | ICD-10-CM | POA: Diagnosis not present

## 2022-10-30 DIAGNOSIS — Z658 Other specified problems related to psychosocial circumstances: Secondary | ICD-10-CM | POA: Diagnosis not present

## 2022-11-06 DIAGNOSIS — F341 Dysthymic disorder: Secondary | ICD-10-CM | POA: Diagnosis not present

## 2022-11-06 DIAGNOSIS — Z658 Other specified problems related to psychosocial circumstances: Secondary | ICD-10-CM | POA: Diagnosis not present

## 2022-11-06 DIAGNOSIS — Z600 Problems of adjustment to life-cycle transitions: Secondary | ICD-10-CM | POA: Diagnosis not present

## 2022-11-13 DIAGNOSIS — F341 Dysthymic disorder: Secondary | ICD-10-CM | POA: Diagnosis not present

## 2022-11-13 DIAGNOSIS — Z600 Problems of adjustment to life-cycle transitions: Secondary | ICD-10-CM | POA: Diagnosis not present

## 2022-11-13 DIAGNOSIS — Z658 Other specified problems related to psychosocial circumstances: Secondary | ICD-10-CM | POA: Diagnosis not present

## 2022-11-20 DIAGNOSIS — F341 Dysthymic disorder: Secondary | ICD-10-CM | POA: Diagnosis not present

## 2022-11-20 DIAGNOSIS — Z569 Unspecified problems related to employment: Secondary | ICD-10-CM | POA: Diagnosis not present

## 2022-11-20 DIAGNOSIS — Z658 Other specified problems related to psychosocial circumstances: Secondary | ICD-10-CM | POA: Diagnosis not present

## 2022-11-27 DIAGNOSIS — Z600 Problems of adjustment to life-cycle transitions: Secondary | ICD-10-CM | POA: Diagnosis not present

## 2022-11-27 DIAGNOSIS — Z658 Other specified problems related to psychosocial circumstances: Secondary | ICD-10-CM | POA: Diagnosis not present

## 2022-11-27 DIAGNOSIS — F341 Dysthymic disorder: Secondary | ICD-10-CM | POA: Diagnosis not present

## 2022-12-04 DIAGNOSIS — Z658 Other specified problems related to psychosocial circumstances: Secondary | ICD-10-CM | POA: Diagnosis not present

## 2022-12-04 DIAGNOSIS — Z569 Unspecified problems related to employment: Secondary | ICD-10-CM | POA: Diagnosis not present

## 2022-12-04 DIAGNOSIS — Z600 Problems of adjustment to life-cycle transitions: Secondary | ICD-10-CM | POA: Diagnosis not present

## 2022-12-04 DIAGNOSIS — F341 Dysthymic disorder: Secondary | ICD-10-CM | POA: Diagnosis not present

## 2022-12-11 DIAGNOSIS — Z569 Unspecified problems related to employment: Secondary | ICD-10-CM | POA: Diagnosis not present

## 2022-12-11 DIAGNOSIS — Z600 Problems of adjustment to life-cycle transitions: Secondary | ICD-10-CM | POA: Diagnosis not present

## 2022-12-11 DIAGNOSIS — F341 Dysthymic disorder: Secondary | ICD-10-CM | POA: Diagnosis not present

## 2022-12-11 DIAGNOSIS — Z658 Other specified problems related to psychosocial circumstances: Secondary | ICD-10-CM | POA: Diagnosis not present

## 2022-12-18 DIAGNOSIS — Z658 Other specified problems related to psychosocial circumstances: Secondary | ICD-10-CM | POA: Diagnosis not present

## 2022-12-18 DIAGNOSIS — Z600 Problems of adjustment to life-cycle transitions: Secondary | ICD-10-CM | POA: Diagnosis not present

## 2022-12-18 DIAGNOSIS — F341 Dysthymic disorder: Secondary | ICD-10-CM | POA: Diagnosis not present

## 2022-12-25 DIAGNOSIS — Z600 Problems of adjustment to life-cycle transitions: Secondary | ICD-10-CM | POA: Diagnosis not present

## 2022-12-25 DIAGNOSIS — F341 Dysthymic disorder: Secondary | ICD-10-CM | POA: Diagnosis not present

## 2022-12-25 DIAGNOSIS — Z658 Other specified problems related to psychosocial circumstances: Secondary | ICD-10-CM | POA: Diagnosis not present

## 2023-01-01 DIAGNOSIS — Z658 Other specified problems related to psychosocial circumstances: Secondary | ICD-10-CM | POA: Diagnosis not present

## 2023-01-01 DIAGNOSIS — F341 Dysthymic disorder: Secondary | ICD-10-CM | POA: Diagnosis not present

## 2023-01-01 DIAGNOSIS — Z600 Problems of adjustment to life-cycle transitions: Secondary | ICD-10-CM | POA: Diagnosis not present

## 2023-01-08 DIAGNOSIS — Z729 Problem related to lifestyle, unspecified: Secondary | ICD-10-CM | POA: Diagnosis not present

## 2023-01-08 DIAGNOSIS — F341 Dysthymic disorder: Secondary | ICD-10-CM | POA: Diagnosis not present

## 2023-01-08 DIAGNOSIS — Z600 Problems of adjustment to life-cycle transitions: Secondary | ICD-10-CM | POA: Diagnosis not present

## 2023-01-08 DIAGNOSIS — Z569 Unspecified problems related to employment: Secondary | ICD-10-CM | POA: Diagnosis not present

## 2023-01-15 DIAGNOSIS — Z658 Other specified problems related to psychosocial circumstances: Secondary | ICD-10-CM | POA: Diagnosis not present

## 2023-01-15 DIAGNOSIS — Z729 Problem related to lifestyle, unspecified: Secondary | ICD-10-CM | POA: Diagnosis not present

## 2023-01-15 DIAGNOSIS — Z600 Problems of adjustment to life-cycle transitions: Secondary | ICD-10-CM | POA: Diagnosis not present

## 2023-01-15 DIAGNOSIS — F341 Dysthymic disorder: Secondary | ICD-10-CM | POA: Diagnosis not present

## 2023-01-22 DIAGNOSIS — Z569 Unspecified problems related to employment: Secondary | ICD-10-CM | POA: Diagnosis not present

## 2023-01-22 DIAGNOSIS — Z658 Other specified problems related to psychosocial circumstances: Secondary | ICD-10-CM | POA: Diagnosis not present

## 2023-01-22 DIAGNOSIS — F341 Dysthymic disorder: Secondary | ICD-10-CM | POA: Diagnosis not present

## 2023-01-29 DIAGNOSIS — Z569 Unspecified problems related to employment: Secondary | ICD-10-CM | POA: Diagnosis not present

## 2023-01-29 DIAGNOSIS — Z658 Other specified problems related to psychosocial circumstances: Secondary | ICD-10-CM | POA: Diagnosis not present

## 2023-01-29 DIAGNOSIS — F341 Dysthymic disorder: Secondary | ICD-10-CM | POA: Diagnosis not present

## 2023-02-05 DIAGNOSIS — Z569 Unspecified problems related to employment: Secondary | ICD-10-CM | POA: Diagnosis not present

## 2023-02-05 DIAGNOSIS — Z600 Problems of adjustment to life-cycle transitions: Secondary | ICD-10-CM | POA: Diagnosis not present

## 2023-02-05 DIAGNOSIS — F341 Dysthymic disorder: Secondary | ICD-10-CM | POA: Diagnosis not present

## 2023-02-05 DIAGNOSIS — Z658 Other specified problems related to psychosocial circumstances: Secondary | ICD-10-CM | POA: Diagnosis not present

## 2023-02-12 DIAGNOSIS — F341 Dysthymic disorder: Secondary | ICD-10-CM | POA: Diagnosis not present

## 2023-02-12 DIAGNOSIS — Z658 Other specified problems related to psychosocial circumstances: Secondary | ICD-10-CM | POA: Diagnosis not present

## 2023-02-12 DIAGNOSIS — Z569 Unspecified problems related to employment: Secondary | ICD-10-CM | POA: Diagnosis not present

## 2023-02-12 DIAGNOSIS — Z600 Problems of adjustment to life-cycle transitions: Secondary | ICD-10-CM | POA: Diagnosis not present

## 2023-02-19 DIAGNOSIS — Z600 Problems of adjustment to life-cycle transitions: Secondary | ICD-10-CM | POA: Diagnosis not present

## 2023-02-19 DIAGNOSIS — Z729 Problem related to lifestyle, unspecified: Secondary | ICD-10-CM | POA: Diagnosis not present

## 2023-02-19 DIAGNOSIS — F341 Dysthymic disorder: Secondary | ICD-10-CM | POA: Diagnosis not present

## 2023-02-19 DIAGNOSIS — Z569 Unspecified problems related to employment: Secondary | ICD-10-CM | POA: Diagnosis not present

## 2023-02-27 DIAGNOSIS — Z600 Problems of adjustment to life-cycle transitions: Secondary | ICD-10-CM | POA: Diagnosis not present

## 2023-02-27 DIAGNOSIS — F341 Dysthymic disorder: Secondary | ICD-10-CM | POA: Diagnosis not present

## 2023-02-27 DIAGNOSIS — Z658 Other specified problems related to psychosocial circumstances: Secondary | ICD-10-CM | POA: Diagnosis not present

## 2023-03-04 DIAGNOSIS — H1045 Other chronic allergic conjunctivitis: Secondary | ICD-10-CM | POA: Diagnosis not present

## 2023-03-05 DIAGNOSIS — Z658 Other specified problems related to psychosocial circumstances: Secondary | ICD-10-CM | POA: Diagnosis not present

## 2023-03-05 DIAGNOSIS — Z729 Problem related to lifestyle, unspecified: Secondary | ICD-10-CM | POA: Diagnosis not present

## 2023-03-05 DIAGNOSIS — F341 Dysthymic disorder: Secondary | ICD-10-CM | POA: Diagnosis not present

## 2023-03-12 DIAGNOSIS — Z729 Problem related to lifestyle, unspecified: Secondary | ICD-10-CM | POA: Diagnosis not present

## 2023-03-12 DIAGNOSIS — Z609 Problem related to social environment, unspecified: Secondary | ICD-10-CM | POA: Diagnosis not present

## 2023-03-12 DIAGNOSIS — F341 Dysthymic disorder: Secondary | ICD-10-CM | POA: Diagnosis not present

## 2023-03-12 DIAGNOSIS — Z600 Problems of adjustment to life-cycle transitions: Secondary | ICD-10-CM | POA: Diagnosis not present

## 2023-03-19 DIAGNOSIS — Z658 Other specified problems related to psychosocial circumstances: Secondary | ICD-10-CM | POA: Diagnosis not present

## 2023-03-19 DIAGNOSIS — F341 Dysthymic disorder: Secondary | ICD-10-CM | POA: Diagnosis not present

## 2023-03-19 DIAGNOSIS — Z600 Problems of adjustment to life-cycle transitions: Secondary | ICD-10-CM | POA: Diagnosis not present

## 2023-03-19 DIAGNOSIS — Z729 Problem related to lifestyle, unspecified: Secondary | ICD-10-CM | POA: Diagnosis not present

## 2023-03-26 DIAGNOSIS — F341 Dysthymic disorder: Secondary | ICD-10-CM | POA: Diagnosis not present

## 2023-03-26 DIAGNOSIS — Z658 Other specified problems related to psychosocial circumstances: Secondary | ICD-10-CM | POA: Diagnosis not present

## 2023-03-26 DIAGNOSIS — Z600 Problems of adjustment to life-cycle transitions: Secondary | ICD-10-CM | POA: Diagnosis not present

## 2023-04-02 DIAGNOSIS — Z658 Other specified problems related to psychosocial circumstances: Secondary | ICD-10-CM | POA: Diagnosis not present

## 2023-04-02 DIAGNOSIS — Z729 Problem related to lifestyle, unspecified: Secondary | ICD-10-CM | POA: Diagnosis not present

## 2023-04-02 DIAGNOSIS — F341 Dysthymic disorder: Secondary | ICD-10-CM | POA: Diagnosis not present

## 2023-04-09 DIAGNOSIS — Z569 Unspecified problems related to employment: Secondary | ICD-10-CM | POA: Diagnosis not present

## 2023-04-09 DIAGNOSIS — Z658 Other specified problems related to psychosocial circumstances: Secondary | ICD-10-CM | POA: Diagnosis not present

## 2023-04-09 DIAGNOSIS — F341 Dysthymic disorder: Secondary | ICD-10-CM | POA: Diagnosis not present

## 2023-04-16 DIAGNOSIS — Z658 Other specified problems related to psychosocial circumstances: Secondary | ICD-10-CM | POA: Diagnosis not present

## 2023-04-16 DIAGNOSIS — Z609 Problem related to social environment, unspecified: Secondary | ICD-10-CM | POA: Diagnosis not present

## 2023-04-16 DIAGNOSIS — Z569 Unspecified problems related to employment: Secondary | ICD-10-CM | POA: Diagnosis not present

## 2023-04-16 DIAGNOSIS — F341 Dysthymic disorder: Secondary | ICD-10-CM | POA: Diagnosis not present

## 2023-04-23 DIAGNOSIS — Z658 Other specified problems related to psychosocial circumstances: Secondary | ICD-10-CM | POA: Diagnosis not present

## 2023-04-23 DIAGNOSIS — Z600 Problems of adjustment to life-cycle transitions: Secondary | ICD-10-CM | POA: Diagnosis not present

## 2023-04-23 DIAGNOSIS — F341 Dysthymic disorder: Secondary | ICD-10-CM | POA: Diagnosis not present

## 2023-04-30 DIAGNOSIS — Z658 Other specified problems related to psychosocial circumstances: Secondary | ICD-10-CM | POA: Diagnosis not present

## 2023-04-30 DIAGNOSIS — Z569 Unspecified problems related to employment: Secondary | ICD-10-CM | POA: Diagnosis not present

## 2023-04-30 DIAGNOSIS — F341 Dysthymic disorder: Secondary | ICD-10-CM | POA: Diagnosis not present

## 2023-04-30 DIAGNOSIS — Z729 Problem related to lifestyle, unspecified: Secondary | ICD-10-CM | POA: Diagnosis not present

## 2023-05-07 DIAGNOSIS — F341 Dysthymic disorder: Secondary | ICD-10-CM | POA: Diagnosis not present

## 2023-05-07 DIAGNOSIS — Z609 Problem related to social environment, unspecified: Secondary | ICD-10-CM | POA: Diagnosis not present

## 2023-05-07 DIAGNOSIS — Z729 Problem related to lifestyle, unspecified: Secondary | ICD-10-CM | POA: Diagnosis not present

## 2023-05-07 DIAGNOSIS — Z569 Unspecified problems related to employment: Secondary | ICD-10-CM | POA: Diagnosis not present

## 2023-05-14 DIAGNOSIS — Z569 Unspecified problems related to employment: Secondary | ICD-10-CM | POA: Diagnosis not present

## 2023-05-14 DIAGNOSIS — F341 Dysthymic disorder: Secondary | ICD-10-CM | POA: Diagnosis not present

## 2023-05-21 DIAGNOSIS — Z658 Other specified problems related to psychosocial circumstances: Secondary | ICD-10-CM | POA: Diagnosis not present

## 2023-05-21 DIAGNOSIS — Z569 Unspecified problems related to employment: Secondary | ICD-10-CM | POA: Diagnosis not present

## 2023-05-21 DIAGNOSIS — F341 Dysthymic disorder: Secondary | ICD-10-CM | POA: Diagnosis not present

## 2023-05-21 DIAGNOSIS — Z729 Problem related to lifestyle, unspecified: Secondary | ICD-10-CM | POA: Diagnosis not present

## 2023-05-28 DIAGNOSIS — F341 Dysthymic disorder: Secondary | ICD-10-CM | POA: Diagnosis not present

## 2023-05-28 DIAGNOSIS — Z658 Other specified problems related to psychosocial circumstances: Secondary | ICD-10-CM | POA: Diagnosis not present

## 2023-05-28 DIAGNOSIS — Z569 Unspecified problems related to employment: Secondary | ICD-10-CM | POA: Diagnosis not present

## 2023-06-04 DIAGNOSIS — F341 Dysthymic disorder: Secondary | ICD-10-CM | POA: Diagnosis not present

## 2023-06-04 DIAGNOSIS — Z658 Other specified problems related to psychosocial circumstances: Secondary | ICD-10-CM | POA: Diagnosis not present

## 2023-06-11 DIAGNOSIS — F341 Dysthymic disorder: Secondary | ICD-10-CM | POA: Diagnosis not present

## 2023-06-11 DIAGNOSIS — Z658 Other specified problems related to psychosocial circumstances: Secondary | ICD-10-CM | POA: Diagnosis not present

## 2023-06-11 DIAGNOSIS — Z600 Problems of adjustment to life-cycle transitions: Secondary | ICD-10-CM | POA: Diagnosis not present

## 2023-06-19 DIAGNOSIS — Z600 Problems of adjustment to life-cycle transitions: Secondary | ICD-10-CM | POA: Diagnosis not present

## 2023-06-19 DIAGNOSIS — F341 Dysthymic disorder: Secondary | ICD-10-CM | POA: Diagnosis not present

## 2023-06-19 DIAGNOSIS — Z569 Unspecified problems related to employment: Secondary | ICD-10-CM | POA: Diagnosis not present

## 2023-06-25 DIAGNOSIS — F341 Dysthymic disorder: Secondary | ICD-10-CM | POA: Diagnosis not present

## 2023-06-25 DIAGNOSIS — Z600 Problems of adjustment to life-cycle transitions: Secondary | ICD-10-CM | POA: Diagnosis not present

## 2023-06-25 DIAGNOSIS — Z569 Unspecified problems related to employment: Secondary | ICD-10-CM | POA: Diagnosis not present

## 2023-06-25 DIAGNOSIS — Z658 Other specified problems related to psychosocial circumstances: Secondary | ICD-10-CM | POA: Diagnosis not present

## 2023-07-02 DIAGNOSIS — Z658 Other specified problems related to psychosocial circumstances: Secondary | ICD-10-CM | POA: Diagnosis not present

## 2023-07-02 DIAGNOSIS — Z569 Unspecified problems related to employment: Secondary | ICD-10-CM | POA: Diagnosis not present

## 2023-07-02 DIAGNOSIS — F341 Dysthymic disorder: Secondary | ICD-10-CM | POA: Diagnosis not present

## 2023-07-09 DIAGNOSIS — Z658 Other specified problems related to psychosocial circumstances: Secondary | ICD-10-CM | POA: Diagnosis not present

## 2023-07-09 DIAGNOSIS — Z569 Unspecified problems related to employment: Secondary | ICD-10-CM | POA: Diagnosis not present

## 2023-07-09 DIAGNOSIS — F341 Dysthymic disorder: Secondary | ICD-10-CM | POA: Diagnosis not present

## 2023-07-16 DIAGNOSIS — Z569 Unspecified problems related to employment: Secondary | ICD-10-CM | POA: Diagnosis not present

## 2023-07-16 DIAGNOSIS — F341 Dysthymic disorder: Secondary | ICD-10-CM | POA: Diagnosis not present

## 2023-07-16 DIAGNOSIS — Z600 Problems of adjustment to life-cycle transitions: Secondary | ICD-10-CM | POA: Diagnosis not present

## 2023-07-23 DIAGNOSIS — F341 Dysthymic disorder: Secondary | ICD-10-CM | POA: Diagnosis not present

## 2023-07-23 DIAGNOSIS — Z609 Problem related to social environment, unspecified: Secondary | ICD-10-CM | POA: Diagnosis not present

## 2023-07-23 DIAGNOSIS — Z600 Problems of adjustment to life-cycle transitions: Secondary | ICD-10-CM | POA: Diagnosis not present

## 2023-07-30 DIAGNOSIS — F341 Dysthymic disorder: Secondary | ICD-10-CM | POA: Diagnosis not present

## 2023-07-30 DIAGNOSIS — Z609 Problem related to social environment, unspecified: Secondary | ICD-10-CM | POA: Diagnosis not present

## 2023-07-30 DIAGNOSIS — Z569 Unspecified problems related to employment: Secondary | ICD-10-CM | POA: Diagnosis not present

## 2023-07-30 DIAGNOSIS — Z658 Other specified problems related to psychosocial circumstances: Secondary | ICD-10-CM | POA: Diagnosis not present

## 2023-08-06 DIAGNOSIS — F341 Dysthymic disorder: Secondary | ICD-10-CM | POA: Diagnosis not present

## 2023-08-06 DIAGNOSIS — Z569 Unspecified problems related to employment: Secondary | ICD-10-CM | POA: Diagnosis not present

## 2023-08-06 DIAGNOSIS — Z658 Other specified problems related to psychosocial circumstances: Secondary | ICD-10-CM | POA: Diagnosis not present

## 2023-08-13 DIAGNOSIS — Z569 Unspecified problems related to employment: Secondary | ICD-10-CM | POA: Diagnosis not present

## 2023-08-13 DIAGNOSIS — Z658 Other specified problems related to psychosocial circumstances: Secondary | ICD-10-CM | POA: Diagnosis not present

## 2023-08-13 DIAGNOSIS — F341 Dysthymic disorder: Secondary | ICD-10-CM | POA: Diagnosis not present

## 2023-08-13 DIAGNOSIS — Z600 Problems of adjustment to life-cycle transitions: Secondary | ICD-10-CM | POA: Diagnosis not present

## 2023-08-20 DIAGNOSIS — Z600 Problems of adjustment to life-cycle transitions: Secondary | ICD-10-CM | POA: Diagnosis not present

## 2023-08-20 DIAGNOSIS — Z569 Unspecified problems related to employment: Secondary | ICD-10-CM | POA: Diagnosis not present

## 2023-08-20 DIAGNOSIS — F341 Dysthymic disorder: Secondary | ICD-10-CM | POA: Diagnosis not present

## 2023-08-27 DIAGNOSIS — Z600 Problems of adjustment to life-cycle transitions: Secondary | ICD-10-CM | POA: Diagnosis not present

## 2023-08-27 DIAGNOSIS — F341 Dysthymic disorder: Secondary | ICD-10-CM | POA: Diagnosis not present

## 2023-08-27 DIAGNOSIS — Z569 Unspecified problems related to employment: Secondary | ICD-10-CM | POA: Diagnosis not present

## 2023-09-03 DIAGNOSIS — Z609 Problem related to social environment, unspecified: Secondary | ICD-10-CM | POA: Diagnosis not present

## 2023-09-03 DIAGNOSIS — F341 Dysthymic disorder: Secondary | ICD-10-CM | POA: Diagnosis not present

## 2023-09-03 DIAGNOSIS — Z569 Unspecified problems related to employment: Secondary | ICD-10-CM | POA: Diagnosis not present

## 2023-09-03 DIAGNOSIS — Z600 Problems of adjustment to life-cycle transitions: Secondary | ICD-10-CM | POA: Diagnosis not present

## 2023-09-10 DIAGNOSIS — Z658 Other specified problems related to psychosocial circumstances: Secondary | ICD-10-CM | POA: Diagnosis not present

## 2023-09-10 DIAGNOSIS — F341 Dysthymic disorder: Secondary | ICD-10-CM | POA: Diagnosis not present

## 2023-09-10 DIAGNOSIS — Z729 Problem related to lifestyle, unspecified: Secondary | ICD-10-CM | POA: Diagnosis not present

## 2023-09-17 DIAGNOSIS — Z658 Other specified problems related to psychosocial circumstances: Secondary | ICD-10-CM | POA: Diagnosis not present

## 2023-09-17 DIAGNOSIS — Z569 Unspecified problems related to employment: Secondary | ICD-10-CM | POA: Diagnosis not present

## 2023-09-17 DIAGNOSIS — F341 Dysthymic disorder: Secondary | ICD-10-CM | POA: Diagnosis not present

## 2023-09-17 DIAGNOSIS — Z609 Problem related to social environment, unspecified: Secondary | ICD-10-CM | POA: Diagnosis not present

## 2023-09-24 DIAGNOSIS — Z569 Unspecified problems related to employment: Secondary | ICD-10-CM | POA: Diagnosis not present

## 2023-09-24 DIAGNOSIS — Z729 Problem related to lifestyle, unspecified: Secondary | ICD-10-CM | POA: Diagnosis not present

## 2023-09-24 DIAGNOSIS — Z658 Other specified problems related to psychosocial circumstances: Secondary | ICD-10-CM | POA: Diagnosis not present

## 2023-09-24 DIAGNOSIS — F341 Dysthymic disorder: Secondary | ICD-10-CM | POA: Diagnosis not present

## 2023-10-01 DIAGNOSIS — F341 Dysthymic disorder: Secondary | ICD-10-CM | POA: Diagnosis not present

## 2023-10-01 DIAGNOSIS — Z600 Problems of adjustment to life-cycle transitions: Secondary | ICD-10-CM | POA: Diagnosis not present

## 2023-10-01 DIAGNOSIS — Z658 Other specified problems related to psychosocial circumstances: Secondary | ICD-10-CM | POA: Diagnosis not present

## 2023-10-08 DIAGNOSIS — Z569 Unspecified problems related to employment: Secondary | ICD-10-CM | POA: Diagnosis not present

## 2023-10-08 DIAGNOSIS — F341 Dysthymic disorder: Secondary | ICD-10-CM | POA: Diagnosis not present

## 2023-10-08 DIAGNOSIS — Z658 Other specified problems related to psychosocial circumstances: Secondary | ICD-10-CM | POA: Diagnosis not present

## 2023-10-08 DIAGNOSIS — Z600 Problems of adjustment to life-cycle transitions: Secondary | ICD-10-CM | POA: Diagnosis not present

## 2023-10-15 DIAGNOSIS — F341 Dysthymic disorder: Secondary | ICD-10-CM | POA: Diagnosis not present

## 2023-10-15 DIAGNOSIS — Z658 Other specified problems related to psychosocial circumstances: Secondary | ICD-10-CM | POA: Diagnosis not present

## 2023-10-15 DIAGNOSIS — Z638 Other specified problems related to primary support group: Secondary | ICD-10-CM | POA: Diagnosis not present

## 2023-10-22 DIAGNOSIS — Z569 Unspecified problems related to employment: Secondary | ICD-10-CM | POA: Diagnosis not present

## 2023-10-22 DIAGNOSIS — Z658 Other specified problems related to psychosocial circumstances: Secondary | ICD-10-CM | POA: Diagnosis not present

## 2023-10-22 DIAGNOSIS — Z600 Problems of adjustment to life-cycle transitions: Secondary | ICD-10-CM | POA: Diagnosis not present

## 2023-10-22 DIAGNOSIS — F341 Dysthymic disorder: Secondary | ICD-10-CM | POA: Diagnosis not present

## 2023-10-29 DIAGNOSIS — Z569 Unspecified problems related to employment: Secondary | ICD-10-CM | POA: Diagnosis not present

## 2023-10-29 DIAGNOSIS — F341 Dysthymic disorder: Secondary | ICD-10-CM | POA: Diagnosis not present

## 2023-10-29 DIAGNOSIS — Z658 Other specified problems related to psychosocial circumstances: Secondary | ICD-10-CM | POA: Diagnosis not present

## 2023-11-05 DIAGNOSIS — Z658 Other specified problems related to psychosocial circumstances: Secondary | ICD-10-CM | POA: Diagnosis not present

## 2023-11-05 DIAGNOSIS — Z569 Unspecified problems related to employment: Secondary | ICD-10-CM | POA: Diagnosis not present

## 2023-11-05 DIAGNOSIS — F341 Dysthymic disorder: Secondary | ICD-10-CM | POA: Diagnosis not present

## 2023-11-12 DIAGNOSIS — Z569 Unspecified problems related to employment: Secondary | ICD-10-CM | POA: Diagnosis not present

## 2023-11-12 DIAGNOSIS — Z658 Other specified problems related to psychosocial circumstances: Secondary | ICD-10-CM | POA: Diagnosis not present

## 2023-11-12 DIAGNOSIS — F341 Dysthymic disorder: Secondary | ICD-10-CM | POA: Diagnosis not present

## 2023-11-19 DIAGNOSIS — Z569 Unspecified problems related to employment: Secondary | ICD-10-CM | POA: Diagnosis not present

## 2023-11-19 DIAGNOSIS — F341 Dysthymic disorder: Secondary | ICD-10-CM | POA: Diagnosis not present

## 2023-11-19 DIAGNOSIS — Z658 Other specified problems related to psychosocial circumstances: Secondary | ICD-10-CM | POA: Diagnosis not present

## 2023-11-26 DIAGNOSIS — F341 Dysthymic disorder: Secondary | ICD-10-CM | POA: Diagnosis not present

## 2023-11-26 DIAGNOSIS — Z569 Unspecified problems related to employment: Secondary | ICD-10-CM | POA: Diagnosis not present

## 2023-11-26 DIAGNOSIS — Z658 Other specified problems related to psychosocial circumstances: Secondary | ICD-10-CM | POA: Diagnosis not present

## 2023-12-03 DIAGNOSIS — Z658 Other specified problems related to psychosocial circumstances: Secondary | ICD-10-CM | POA: Diagnosis not present

## 2023-12-03 DIAGNOSIS — Z729 Problem related to lifestyle, unspecified: Secondary | ICD-10-CM | POA: Diagnosis not present

## 2023-12-03 DIAGNOSIS — F341 Dysthymic disorder: Secondary | ICD-10-CM | POA: Diagnosis not present

## 2023-12-03 DIAGNOSIS — Z569 Unspecified problems related to employment: Secondary | ICD-10-CM | POA: Diagnosis not present

## 2023-12-10 DIAGNOSIS — F341 Dysthymic disorder: Secondary | ICD-10-CM | POA: Diagnosis not present

## 2023-12-10 DIAGNOSIS — Z569 Unspecified problems related to employment: Secondary | ICD-10-CM | POA: Diagnosis not present

## 2023-12-10 DIAGNOSIS — Z658 Other specified problems related to psychosocial circumstances: Secondary | ICD-10-CM | POA: Diagnosis not present

## 2023-12-16 DIAGNOSIS — F341 Dysthymic disorder: Secondary | ICD-10-CM | POA: Diagnosis not present

## 2023-12-16 DIAGNOSIS — Z658 Other specified problems related to psychosocial circumstances: Secondary | ICD-10-CM | POA: Diagnosis not present

## 2023-12-16 DIAGNOSIS — Z569 Unspecified problems related to employment: Secondary | ICD-10-CM | POA: Diagnosis not present

## 2023-12-24 DIAGNOSIS — F341 Dysthymic disorder: Secondary | ICD-10-CM | POA: Diagnosis not present

## 2023-12-24 DIAGNOSIS — Z569 Unspecified problems related to employment: Secondary | ICD-10-CM | POA: Diagnosis not present

## 2023-12-24 DIAGNOSIS — Z729 Problem related to lifestyle, unspecified: Secondary | ICD-10-CM | POA: Diagnosis not present

## 2023-12-31 DIAGNOSIS — F341 Dysthymic disorder: Secondary | ICD-10-CM | POA: Diagnosis not present

## 2023-12-31 DIAGNOSIS — Z609 Problem related to social environment, unspecified: Secondary | ICD-10-CM | POA: Diagnosis not present

## 2023-12-31 DIAGNOSIS — Z569 Unspecified problems related to employment: Secondary | ICD-10-CM | POA: Diagnosis not present

## 2024-01-07 DIAGNOSIS — F341 Dysthymic disorder: Secondary | ICD-10-CM | POA: Diagnosis not present

## 2024-01-07 DIAGNOSIS — Z569 Unspecified problems related to employment: Secondary | ICD-10-CM | POA: Diagnosis not present

## 2024-01-07 DIAGNOSIS — Z729 Problem related to lifestyle, unspecified: Secondary | ICD-10-CM | POA: Diagnosis not present

## 2024-01-07 DIAGNOSIS — Z609 Problem related to social environment, unspecified: Secondary | ICD-10-CM | POA: Diagnosis not present

## 2024-01-14 DIAGNOSIS — Z609 Problem related to social environment, unspecified: Secondary | ICD-10-CM | POA: Diagnosis not present

## 2024-01-14 DIAGNOSIS — Z658 Other specified problems related to psychosocial circumstances: Secondary | ICD-10-CM | POA: Diagnosis not present

## 2024-01-14 DIAGNOSIS — F341 Dysthymic disorder: Secondary | ICD-10-CM | POA: Diagnosis not present

## 2024-01-21 DIAGNOSIS — Z729 Problem related to lifestyle, unspecified: Secondary | ICD-10-CM | POA: Diagnosis not present

## 2024-01-21 DIAGNOSIS — Z609 Problem related to social environment, unspecified: Secondary | ICD-10-CM | POA: Diagnosis not present

## 2024-01-21 DIAGNOSIS — Z569 Unspecified problems related to employment: Secondary | ICD-10-CM | POA: Diagnosis not present

## 2024-01-21 DIAGNOSIS — F341 Dysthymic disorder: Secondary | ICD-10-CM | POA: Diagnosis not present

## 2024-01-28 DIAGNOSIS — Z609 Problem related to social environment, unspecified: Secondary | ICD-10-CM | POA: Diagnosis not present

## 2024-01-28 DIAGNOSIS — F341 Dysthymic disorder: Secondary | ICD-10-CM | POA: Diagnosis not present

## 2024-01-28 DIAGNOSIS — Z658 Other specified problems related to psychosocial circumstances: Secondary | ICD-10-CM | POA: Diagnosis not present

## 2024-02-04 DIAGNOSIS — Z600 Problems of adjustment to life-cycle transitions: Secondary | ICD-10-CM | POA: Diagnosis not present

## 2024-02-04 DIAGNOSIS — F341 Dysthymic disorder: Secondary | ICD-10-CM | POA: Diagnosis not present

## 2024-02-04 DIAGNOSIS — Z569 Unspecified problems related to employment: Secondary | ICD-10-CM | POA: Diagnosis not present

## 2024-02-11 DIAGNOSIS — Z609 Problem related to social environment, unspecified: Secondary | ICD-10-CM | POA: Diagnosis not present

## 2024-02-11 DIAGNOSIS — Z569 Unspecified problems related to employment: Secondary | ICD-10-CM | POA: Diagnosis not present

## 2024-02-11 DIAGNOSIS — F341 Dysthymic disorder: Secondary | ICD-10-CM | POA: Diagnosis not present

## 2024-02-19 DIAGNOSIS — Z729 Problem related to lifestyle, unspecified: Secondary | ICD-10-CM | POA: Diagnosis not present

## 2024-02-19 DIAGNOSIS — Z600 Problems of adjustment to life-cycle transitions: Secondary | ICD-10-CM | POA: Diagnosis not present

## 2024-02-19 DIAGNOSIS — F341 Dysthymic disorder: Secondary | ICD-10-CM | POA: Diagnosis not present

## 2024-02-26 DIAGNOSIS — Z729 Problem related to lifestyle, unspecified: Secondary | ICD-10-CM | POA: Diagnosis not present

## 2024-02-26 DIAGNOSIS — Z600 Problems of adjustment to life-cycle transitions: Secondary | ICD-10-CM | POA: Diagnosis not present

## 2024-02-26 DIAGNOSIS — F341 Dysthymic disorder: Secondary | ICD-10-CM | POA: Diagnosis not present

## 2024-03-03 DIAGNOSIS — Z600 Problems of adjustment to life-cycle transitions: Secondary | ICD-10-CM | POA: Diagnosis not present

## 2024-03-03 DIAGNOSIS — Z658 Other specified problems related to psychosocial circumstances: Secondary | ICD-10-CM | POA: Diagnosis not present

## 2024-03-03 DIAGNOSIS — F341 Dysthymic disorder: Secondary | ICD-10-CM | POA: Diagnosis not present

## 2024-03-10 DIAGNOSIS — Z658 Other specified problems related to psychosocial circumstances: Secondary | ICD-10-CM | POA: Diagnosis not present

## 2024-03-10 DIAGNOSIS — F341 Dysthymic disorder: Secondary | ICD-10-CM | POA: Diagnosis not present

## 2024-03-10 DIAGNOSIS — Z600 Problems of adjustment to life-cycle transitions: Secondary | ICD-10-CM | POA: Diagnosis not present

## 2024-03-17 DIAGNOSIS — Z600 Problems of adjustment to life-cycle transitions: Secondary | ICD-10-CM | POA: Diagnosis not present

## 2024-03-17 DIAGNOSIS — Z658 Other specified problems related to psychosocial circumstances: Secondary | ICD-10-CM | POA: Diagnosis not present

## 2024-03-17 DIAGNOSIS — F341 Dysthymic disorder: Secondary | ICD-10-CM | POA: Diagnosis not present

## 2024-03-25 DIAGNOSIS — Z600 Problems of adjustment to life-cycle transitions: Secondary | ICD-10-CM | POA: Diagnosis not present

## 2024-03-25 DIAGNOSIS — Z569 Unspecified problems related to employment: Secondary | ICD-10-CM | POA: Diagnosis not present

## 2024-03-25 DIAGNOSIS — F341 Dysthymic disorder: Secondary | ICD-10-CM | POA: Diagnosis not present
